# Patient Record
Sex: Female | Born: 1995 | Race: Black or African American | Hispanic: No | Marital: Single | Smoking: Current every day smoker
Health system: Southern US, Community
[De-identification: ages and names within clinical notes are randomized; demographics above are authoritative.]

## PROBLEM LIST (undated history)

## (undated) ENCOUNTER — Emergency Department: Admission: EM | Discharge status: Absent without leave | Payer: Medicaid Other

## (undated) ENCOUNTER — Inpatient Hospital Stay: Payer: Self-pay

## (undated) DIAGNOSIS — J45909 Unspecified asthma, uncomplicated: Secondary | ICD-10-CM

## (undated) DIAGNOSIS — R569 Unspecified convulsions: Secondary | ICD-10-CM

## (undated) DIAGNOSIS — F17200 Nicotine dependence, unspecified, uncomplicated: Secondary | ICD-10-CM

## (undated) HISTORY — DX: Unspecified convulsions: R56.9

## (undated) HISTORY — DX: Nicotine dependence, unspecified, uncomplicated: F17.200

## (undated) HISTORY — DX: Unspecified asthma, uncomplicated: J45.909

---
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
## 2013-04-21 DIAGNOSIS — R5383 Other fatigue: Secondary | ICD-10-CM | POA: Insufficient documentation

## 2013-04-21 DIAGNOSIS — M79672 Pain in left foot: Secondary | ICD-10-CM | POA: Insufficient documentation

## 2013-04-21 DIAGNOSIS — R42 Dizziness and giddiness: Secondary | ICD-10-CM | POA: Insufficient documentation

## 2013-06-25 ENCOUNTER — Emergency Department: Payer: Self-pay

## 2013-06-25 LAB — URINALYSIS, COMPLETE
BILIRUBIN, UR: NEGATIVE
Bacteria: NONE SEEN
Glucose,UR: NEGATIVE mg/dL (ref 0–75)
KETONE: NEGATIVE
Nitrite: NEGATIVE
PH: 6 (ref 4.5–8.0)
PROTEIN: NEGATIVE
Specific Gravity: 1.01 (ref 1.003–1.030)
Squamous Epithelial: 4
WBC UR: 19 /HPF (ref 0–5)

## 2013-06-25 LAB — GC/CHLAMYDIA PROBE AMP

## 2013-06-25 LAB — WET PREP, GENITAL

## 2013-07-19 ENCOUNTER — Emergency Department: Payer: Self-pay | Admitting: Emergency Medicine

## 2013-07-30 ENCOUNTER — Emergency Department: Payer: Self-pay | Admitting: Emergency Medicine

## 2014-04-03 ENCOUNTER — Emergency Department: Payer: Self-pay | Admitting: Emergency Medicine
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
## 2014-08-02 NOTE — Consult Note (Signed)
Referring Physician:  James Ivanoff, Roselie Awkward :   Primary Care Physician:  James Ivanoff, [facility] : [facility], [address] [place], [place] [address], [place] [phone]  Reason for Consult:  Admit Date: 28-Feb-2012   Chief Complaint: altered mental status   Reason for Consult: altered mental status   History of Present Illness:  History of Present Illness:   19 yo RHD F presents to [facility] via ambulance after an unwitnessed fall/seizure in which she barely injured herself.  She was confused for sometime after the episode and did not go to school like she was supposed to.   Pt has no recollection of this event and did not have any bowel/bladder incontinence or bite her tone.   Of note, she had a seizure 2 years ago but was not placed on medication because her EEG was negative.  However for the past 3 months, she has been having dizzy spells followed by fatigue that occur a few times a week.  Mom notes that now her personality is different.  ROS:   General denies complaints    HEENT no complaints    Lungs no complaints    Cardiac no complaints    GI no complaints    GU no complaints    Musculoskeletal no complaints    Extremities no complaints    Skin no complaints    Neuro seizure    Endocrine no complaints    Psych no complaints   Past Medical/Surgical Hx:  Asthma:   Past Medical/ Surgical Hx:   Past Medical History asthma    Past Surgical History none   Allergies:  No Known Allergies:   Allergies:   Allergies NKDA   Social/Family History:  Employment Status: unemployed   Lives With: parents   Living Arrangements: house   Social History: no tob, no EtOH, no illicits, 12 grade student   Family History: n/c   Vital Signs: **Vital Signs.:   15-Nov-13 14:23   Vital Signs Type Routine   Temperature Temperature (F) 98.6   Celsius 37   Temperature Source oral   Pulse Pulse 62   Respirations Respirations 18   Systolic BP Systolic  BP 93   Diastolic BP (mmHg) Diastolic BP (mmHg) 50   Mean BP 64   Pulse Ox % Pulse Ox % 98   Pulse Ox Activity Level  At rest   Oxygen Delivery Room Air/ 21 %   Fetal Heart Tones  136    12:45   Systolic BP Systolic BP 809   Diastolic BP (mmHg) Diastolic BP (mmHg) 73   Mean BP 88   BP Source  if not from Vital Sign Device standing   Physical Exam:  General: NAD resting comfortable in bed, normal weight   HEENT: normocephalic, sclera nonicteric, oropharynx clear   Neck: supple, no JVD, no bruits   Chest: CTA B, no wheezing, good movement   Cardiac: RRR, no murmurs, no edema, 2+ pulses   Extremities: no C/C/E, FROM   Neurologic Exam:  Mental Status: alert and oriented x 3, normal speech and language, follows complex commands;  inappropiate affect   Cranial Nerves: PERRLA, EOMI, nl VF, face symmetric, tongue midline, shoulder shrug equal   Motor Exam: 5/5 B normal, tone, no tremor   Deep Tendon Reflexes: 2+/4 B, plantars downgoing B, no Hoffman   Sensory Exam: decreased pinprick, temperature on R hemibody, nl vibration   Coordination: FTN and HTS WNL, nl RAM, nl gait   Lab Results: Thyroid:  14-Nov-13 13:49    Thyroid Stimulating Hormone 1.01 (0.45-4.50 (International Unit)  ----------------------- Pregnant patients have  different reference  ranges for TSH:  - - - - - - - - - -  Pregnant, first trimetser:  0.36 - 2.50 uIU/mL)  Hepatic:  15-Nov-13 05:05    Bilirubin, Total 0.3   Alkaline Phosphatase  51   SGPT (ALT) 21   SGOT (AST) 23   Total Protein, Serum  6.0   Albumin, Serum  2.7  Routine Micro:  14-Nov-13 17:10    Micro Text Report CSF CULTURE/AERO/GRAM ST   COMMENT                   NO GROWTH IN 8-12 HOURS   GRAM STAIN                RARE WHITE BLOOD CELLS   GRAM STAIN                NO ORGANISMS SEEN   ANTIBIOTIC                        Culture Comment NO GROWTH IN 8-12 HOURS   Gram Stain 1 RARE WHITE BLOOD CELLS   Gram Stain 2 NO ORGANISMS SEEN   Result(s) reported on 28 Feb 2012 at 01:09PM.  Routine Chem:  14-Nov-13 13:49    HCG Betasubunit Quant. Serum  9261 (1-3  (International Unit)  ----------------- Non-pregnant <5 Weeks Post LMP mIU/mL  3- 4 wk 9 - 130  4- 5 wk 75 - 2,600  5- 6 wk 850 - 20,800  6- 7 wk 4,000 - 100,000  7-12 wk 11,500 - 289,000 12-16 wk 18,000 - 137,000 16-29 wk 1,400 - 53,000 29-41 wk 940 - 60,000)  15-Nov-13 05:05    Glucose, Serum 91   BUN  4   Creatinine (comp)  0.55   Sodium, Serum 139   Potassium, Serum 4.2   Chloride, Serum  110   CO2, Serum 23   Calcium (Total), Serum  8.3   Osmolality (calc) 274   eGFR (African American) >60   eGFR (Non-African American) >60 (eGFR values <48m/min/1.73 m2 may be an indication of chronic kidney disease (CKD). Calculated eGFR is useful in patients with stable renal function. The eGFR calculation will not be reliable in acutely ill patients when serum creatinine is changing rapidly. It is not useful in  patients on dialysis. The eGFR calculation may not be applicable to patients at the low and high extremes of body sizes, pregnant women, and vegetarians.)   Anion Gap  6   Magnesium, Serum 1.9 (1.8-2.4 THERAPEUTIC RANGE: 4-7 mg/dL TOXIC: > 10 mg/dL  -----------------------)  Urine Drugs:  142-AST-41196:22   Tricyclic Antidepressant, Ur Qual (comp) NEGATIVE (Result(s) reported on 27 Feb 2012 at 04:23PM.)   Amphetamines, Urine Qual. NEGATIVE   MDMA, Urine Qual. NEGATIVE   Cocaine Metabolite, Urine Qual. NEGATIVE   Opiate, Urine qual NEGATIVE   Phencyclidine, Urine Qual. NEGATIVE   Cannabinoid, Urine Qual. NEGATIVE   Barbiturates, Urine Qual. NEGATIVE   Benzodiazepine, Urine Qual. NEGATIVE (----------------- The URINE DRUG SCREEN provides only a preliminary, unconfirmed analytical test result and should not be used for non-medical  purposes.  Clinical consideration and professional judgment should be  applied to any positive drug screen result  due to possible interfering substances.  A more specific alternate chemical method must be used in order to obtain a confirmed analytical result.  Gas  chromatography/mass spectrometry (GC/MS) is the preferred confirmatory method.)   Methadone, Urine Qual. NEGATIVE  Cardiac:  14-Nov-13 13:49    CK, Total 60   CPK-MB, Serum 0.5 (Result(s) reported on 27 Feb 2012 at 05:56PM.)   Troponin I < 0.02 (0.00-0.05 0.05 ng/mL or less: NEGATIVE  Repeat testing in 3-6 hrs  if clinically indicated. >0.05 ng/mL: POTENTIAL  MYOCARDIAL INJURY. Repeat  testing in 3-6 hrs if  clinically indicated. NOTE: An increase or decrease  of 30% or more on serial  testing suggests a  clinically important change)  Routine UA:  14-Nov-13 15:33    Color (UA) Yellow   Clarity (UA) Clear   Glucose (UA) Negative   Bilirubin (UA) Negative   Ketones (UA) Trace   Specific Gravity (UA) 1.011   Blood (UA) Negative   pH (UA) 8.0   Protein (UA) Negative   Nitrite (UA) Negative   Leukocyte Esterase (UA) Negative (Result(s) reported on 27 Feb 2012 at 03:55PM.)   RBC (UA) NONE SEEN   WBC (UA) <1 /HPF   Bacteria (UA) TRACE   Epithelial Cells (UA) 4 /HPF   Mucous (UA) PRESENT (Result(s) reported on 27 Feb 2012 at 03:55PM.)  CSF Analysis:  14-Nov-13 17:10    CSF Tube # 3   Color - Uncentrifuged (CSF) COLORLESS   Clarity (CSF) CLEAR   Color - Centrifuged (CSF) COLORLESS   RBC  (CSF) 0   WBC  (CSF) 24   Neutrophils  (CSF) 50   Lymphocytes  (CSF) 50   Monocytes/Macrophages  (CSF) 0   Eosinophil  (CSF) 0   Other Cells  (CSF) 0 (Result(s) reported on 27 Feb 2012 at 08:38PM.)   Glucose, CSF 50 (Result(s) reported on 27 Feb 2012 at 07:55PM.)   Protein, CSF 20 (Result(s) reported on 27 Feb 2012 at 07:55PM.)  Routine Coag:  14-Nov-13 13:49    Activated PTT (APTT) 34.3 (A HCT value >55% may artifactually increase the APTT. In one study, the increase was an average of 19%. Reference: "Effect on Routine and Special  Coagulation Testing Values of Citrate Anticoagulant Adjustment in Patients with High HCT Values." American Journal of Clinical Pathology 2006;126:400-405.)  15-Nov-13 05:05    Prothrombin 13.7   INR 1.0 (INR reference interval applies to patients on anticoagulant therapy. A single INR therapeutic range for coumarins is not optimal for all indications; however, the suggested range for most indications is 2.0 - 3.0. Exceptions to the INR Reference Range may include: Prosthetic heart valves, acute myocardial infarction, prevention of myocardial infarction, and combinations of aspirin and anticoagulant. The need for a higher or lower target INR must be assessed individually. Reference: The Pharmacology and Management of the Vitamin K  antagonists: the seventh ACCP Conference on Antithrombotic and Thrombolytic Therapy. XIPJA.2505 Sept:126 (3suppl): [id number]. A HCT value >55% may artifactually increase the PT.  In one study,  the increase was an average of 25%. Reference:  "Effect on Routine and Special Coagulation Testing Values of Citrate Anticoagulant Adjustment in Patients with High HCT Values." American Journal of Clinical Pathology 2006;126:400-405.)  Routine Hem:  15-Nov-13 05:05    WBC (CBC) 6.5   RBC (CBC)  3.64   Hemoglobin (CBC)  11.0   Hematocrit (CBC)  31.0   Platelet Count (CBC) 220   MCV 85   MCH 30.2   MCHC 35.5   RDW 13.2   Neutrophil % 55.6   Lymphocyte % 35.4   Monocyte % 6.9   Eosinophil % 1.8   Basophil %  0.3   Neutrophil # 3.6   Lymphocyte # 2.3   Monocyte # 0.5   Eosinophil # 0.1   Basophil # 0.0 (Result(s) reported on 28 Feb 2012 at [facility].)   Radiology Results: CT:    14-Nov-13 15:35, CT Head Without Contrast   CT Head Without Contrast    REASON FOR EXAM:    confusion alert x1  COMMENTS:       PROCEDURE: CT  - CT HEAD WITHOUT CONTRAST  - Feb 27 2012  3:35PM     RESULT: Comparison:  None    Technique: Multiple axial images from the foramen  magnum to the vertex   were obtained without IV contrast.    Findings:      There is no evidence of mass effect, midline shift, or extra-axial fluid   collections.  There is no evidence of a space-occupying lesion or   intracranial hemorrhage. There is no evidence of a cortical-based area of     acute infarction.    The ventricles and sulci are appropriate for the patient's age. The basal   cisterns are patent.    Visualized portions of the orbits are unremarkable. The visualized   portions of the paranasal sinuses and mastoid air cells are unremarkable.    The osseous structures are unremarkable.    IMPRESSION:      No acute intracranial process.      Dictation Site: 1          Verified By: Jennette Banker, M.D., MD   Radiology Impression:  Radiology Impression: CT of head personally reviewed by me and normal   Impression/Recommendations:  Recommendations:   labs reviewed and noted to have mild leukocytosis in CSF notes reviewed normal d/w referring physician   Probable seizure-  given the fact that risk of seizures outweights the risk of birth defects, we recommend treatment.  Pt also had increase in questionable seizure episodes as well.  There is also mild focality on examination. 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 PoMelrose Nakayaman 3-4 weeks  Electronic Signatures: SmJamison NeighborMD)  (Signed [id number])  Authored: REFERRING PHYSICIAN, Primary Care Physician, Consult, History of Present Illness, Review of Systems, PAST MEDICAL/SURGICAL HISTORY, ALLERGIES, Social/Family History, NURSING VITAL SIGNS, Physical Exam-, LAB RESULTS, RADIOLOGY RESULTS, Recommendations   Last Updated: 15-Nov-13 16:30 by SmJamison NeighborMD)
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
## 2014-09-30 DIAGNOSIS — F17200 Nicotine dependence, unspecified, uncomplicated: Secondary | ICD-10-CM | POA: Insufficient documentation

## 2014-09-30 DIAGNOSIS — R569 Unspecified convulsions: Secondary | ICD-10-CM | POA: Insufficient documentation

## 2014-09-30 DIAGNOSIS — J45909 Unspecified asthma, uncomplicated: Secondary | ICD-10-CM | POA: Insufficient documentation

## 2014-10-04 ENCOUNTER — Encounter: Payer: Self-pay | Admitting: Unknown Physician Specialty

## 2014-10-04 ENCOUNTER — Ambulatory Visit (INDEPENDENT_AMBULATORY_CARE_PROVIDER_SITE_OTHER): Payer: Medicaid Other | Admitting: Unknown Physician Specialty

## 2014-10-04 VITALS — BP 110/65 | HR 74 | Temp 97.9°F | Ht 65.7 in | Wt 134.0 lb

## 2014-10-04 DIAGNOSIS — G40909 Epilepsy, unspecified, not intractable, without status epilepticus: Secondary | ICD-10-CM | POA: Diagnosis not present

## 2014-10-04 DIAGNOSIS — J4520 Mild intermittent asthma, uncomplicated: Secondary | ICD-10-CM | POA: Diagnosis not present

## 2014-10-04 MED ORDER — ALBUTEROL SULFATE HFA 108 (90 BASE) MCG/ACT IN AERS: 2.0000 | INHALATION_SPRAY | Freq: Four times a day (QID) | RESPIRATORY_TRACT | Status: DC | PRN

## 2014-10-04 MED ORDER — NORETHIN ACE-ETH ESTRAD-FE 1-20 MG-MCG PO TABS: 1.0000 | ORAL_TABLET | Freq: Every day | ORAL | Status: DC

## 2014-10-04 NOTE — Progress Notes (Signed)
BP 110/65 mmHg  Pulse 74  Temp(Src) 97.9 F (36.6 C)  Ht 5' 5.7" (1.669 m)  Wt 134 lb (60.782 kg)  BMI 21.82 kg/m2  SpO2 99%  LMP 08/28/2014 (Exact Date)   Subjective:    Patient ID: Miranda King, female    DOB: [birth year], 19 y.o.   MRN: [id number]  HPI: Miranda King is a 19 y.o. female  Chief Complaint  Patient presents with  . Seizures    pt states she went to [facility] ER for having a seizure  . Medication Refill    pt states she needs her birth control and inhaler refilled   Seizures  Chronicity: this is the third one that mother knows about.  She does think she has periods of fainting spells and staring off in the distance. Episode onset: 9 days ago and went to the ER and recieved Keppra 500 mg BID. The problem has been resolved. There were 2 to 3 seizures. Associated symptoms include sleepiness, confusion, speech difficulty and neck stiffness. Pertinent negatives include no vomiting and no diarrhea. Characteristics include rhythmic jerking, loss of consciousness and bit tongue. Characteristics do not include bowel incontinence or bladder incontinence. The episode was witnessed. There was no sensation of an aura present. The seizures did not continue in the ED. Focality: grand mal. Possible causes include sleep deprivation and change in alcohol use. Possible causes do not include medication or dosage change or recent illness. Drinking a little bit 2 nights before There has been no fever.   She would like her BCPs and her inhaler refilled  Relevant past medical, surgical, family and social history reviewed and updated as indicated. Interim medical history since our last visit reviewed. Allergies and medications reviewed and updated.  Review of Systems  Gastrointestinal: Negative for vomiting, diarrhea and bowel incontinence.  Genitourinary: Negative for bladder incontinence.  Neurological: Positive for seizures, loss of consciousness and  speech difficulty.  Psychiatric/Behavioral: Positive for confusion.    Per HPI unless specifically indicated above     Objective:    BP 110/65 mmHg  Pulse 74  Temp(Src) 97.9 F (36.6 C)  Ht 5' 5.7" (1.669 m)  Wt 134 lb (60.782 kg)  BMI 21.82 kg/m2  SpO2 99%  LMP 08/28/2014 (Exact Date)  Wt Readings from Last 3 Encounters:  10/04/14 134 lb (60.782 kg)  04/22/14 135 lb (61.236 kg)    Physical Exam  Constitutional: She is oriented to person, place, and time. She appears well-developed and well-nourished. No distress.  HENT:  Head: Normocephalic and atraumatic.  Eyes: Conjunctivae and lids are normal. Right eye exhibits no discharge. Left eye exhibits no discharge. No scleral icterus.  Cardiovascular: Normal rate, regular rhythm and normal heart sounds.   Pulmonary/Chest: Effort normal and breath sounds normal. No respiratory distress.  Abdominal: Normal appearance. There is no splenomegaly or hepatomegaly.  Musculoskeletal: Normal range of motion.  Neurological: She is alert and oriented to person, place, and time.  Skin: Skin is intact. No rash noted. No pallor.  Psychiatric: She has a normal mood and affect. Her behavior is normal. Judgment and thought content normal.       Assessment & Plan:   Problem List Items Addressed This Visit      Respiratory   Asthma   Relevant Medications   albuterol (PROVENTIL HFA;VENTOLIN HFA) 108 (90 BASE) MCG/ACT inhaler     Nervous and Auditory   Seizure disorder - Primary    Pt is here for a  seizure disorder and is taking Keppra.  We will refer her to neurology as finding the side effects of Keppra of fatigue and sleepiness.        Relevant Orders   Ambulatory referral to Neurology       Follow up plan: Return in about 3 months (around 01/04/2015) for physical.

## 2014-10-04 NOTE — Patient Instructions (Signed)

## 2014-10-04 NOTE — Assessment & Plan Note (Signed)
Pt is here for a seizure disorder and is taking Keppra.  We will refer her to neurology as finding the side effects of Keppra of fatigue and sleepiness.

## 2014-10-05 ENCOUNTER — Emergency Department
Admission: EM | Admit: 2014-10-05 | Discharge: 2014-10-05 | Discharge status: Against Medical Advice | Payer: Medicaid Other | Attending: Emergency Medicine | Admitting: Emergency Medicine

## 2014-10-05 DIAGNOSIS — R569 Unspecified convulsions: Secondary | ICD-10-CM | POA: Diagnosis present

## 2014-10-05 LAB — BASIC METABOLIC PANEL
Anion gap: 9 (ref 5–15)
BUN: 12 mg/dL (ref 6–20)
CALCIUM: 9.2 mg/dL (ref 8.9–10.3)
CHLORIDE: 102 mmol/L (ref 101–111)
CO2: 26 mmol/L (ref 22–32)
Creatinine, Ser: 0.75 mg/dL (ref 0.44–1.00)
GFR calc Af Amer: >60 mL/min (ref >60)
GFR calc non Af Amer: >60 mL/min (ref >60)
GLUCOSE: 89 mg/dL (ref 65–99)
Potassium: 3.9 mmol/L (ref 3.5–5.1)
Sodium: 137 mmol/L (ref 135–145)

## 2014-10-05 LAB — CBC
HEMATOCRIT: 39.9 % (ref 35.0–47.0)
Hemoglobin: 13.7 g/dL (ref 12.0–16.0)
MCH: 28.9 pg (ref 26.0–34.0)
MCHC: 34.2 g/dL (ref 32.0–36.0)
MCV: 84.3 fL (ref 80.0–100.0)
Platelets: 310 10*3/uL (ref 150–440)
RBC: 4.73 MIL/uL (ref 3.80–5.20)
RDW: 13.8 % (ref 11.5–14.5)
WBC: 6 10*3/uL (ref 3.6–11.0)

## 2014-10-05 NOTE — ED Notes (Signed)
Pt was started on keppra last Sunday for seizures, awaiting to see neurologist for testing.  Today she co dizziness, headache and nausea pmd told her to come to ED.

## 2014-11-23 ENCOUNTER — Ambulatory Visit: Payer: Medicaid Other | Admitting: Unknown Physician Specialty

## 2015-03-03 IMAGING — CR DG FOOT COMPLETE 3+V*L*
1 series · 3 of 3 positions shown · non-contrast
Comparison: None.

CLINICAL DATA: Fall down steps, left foot pain

EXAM:
LEFT FOOT - COMPLETE 3+ VIEW

[Series 1: ap · 0.17mm/px · 3 of 3 slices shown]
[im 1/3]
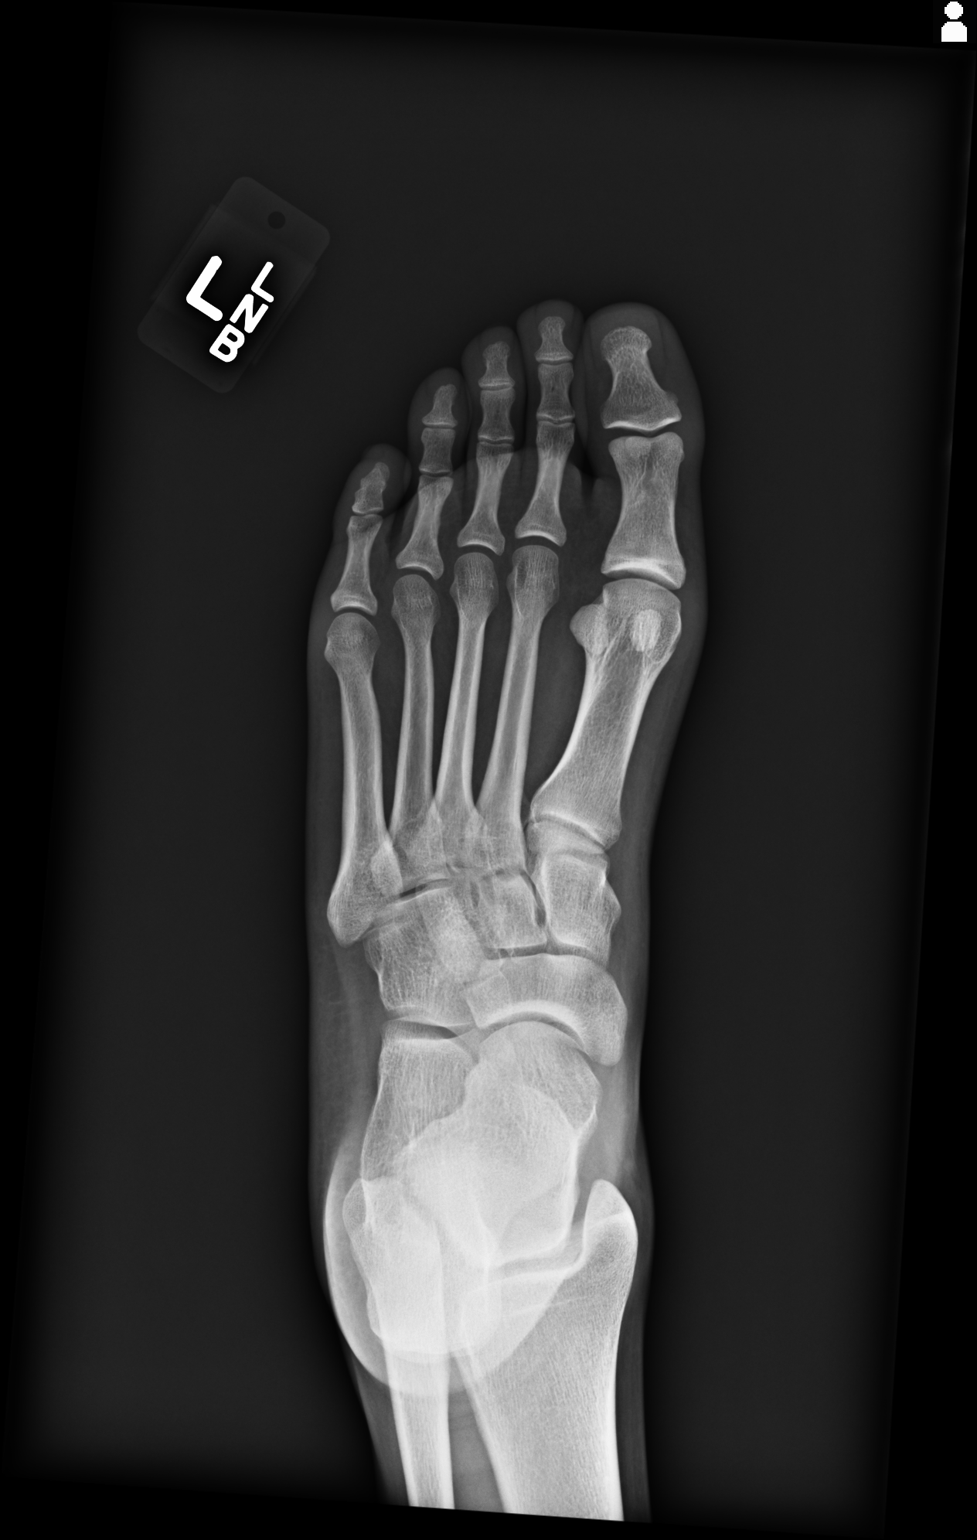
[im 2/3]
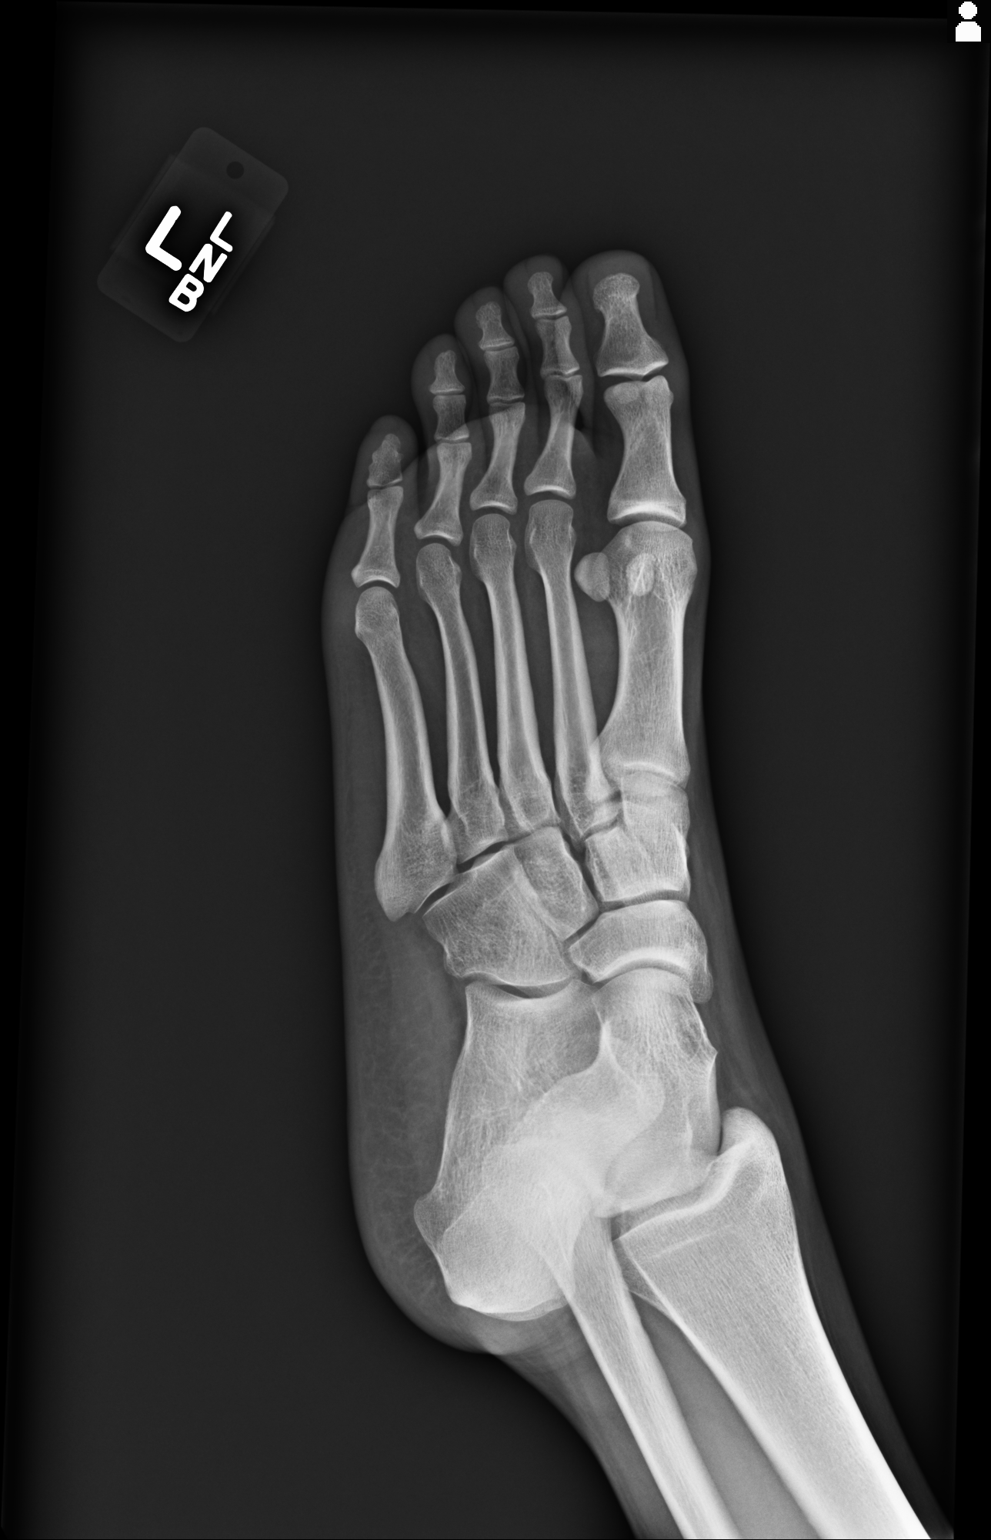
[im 3/3]
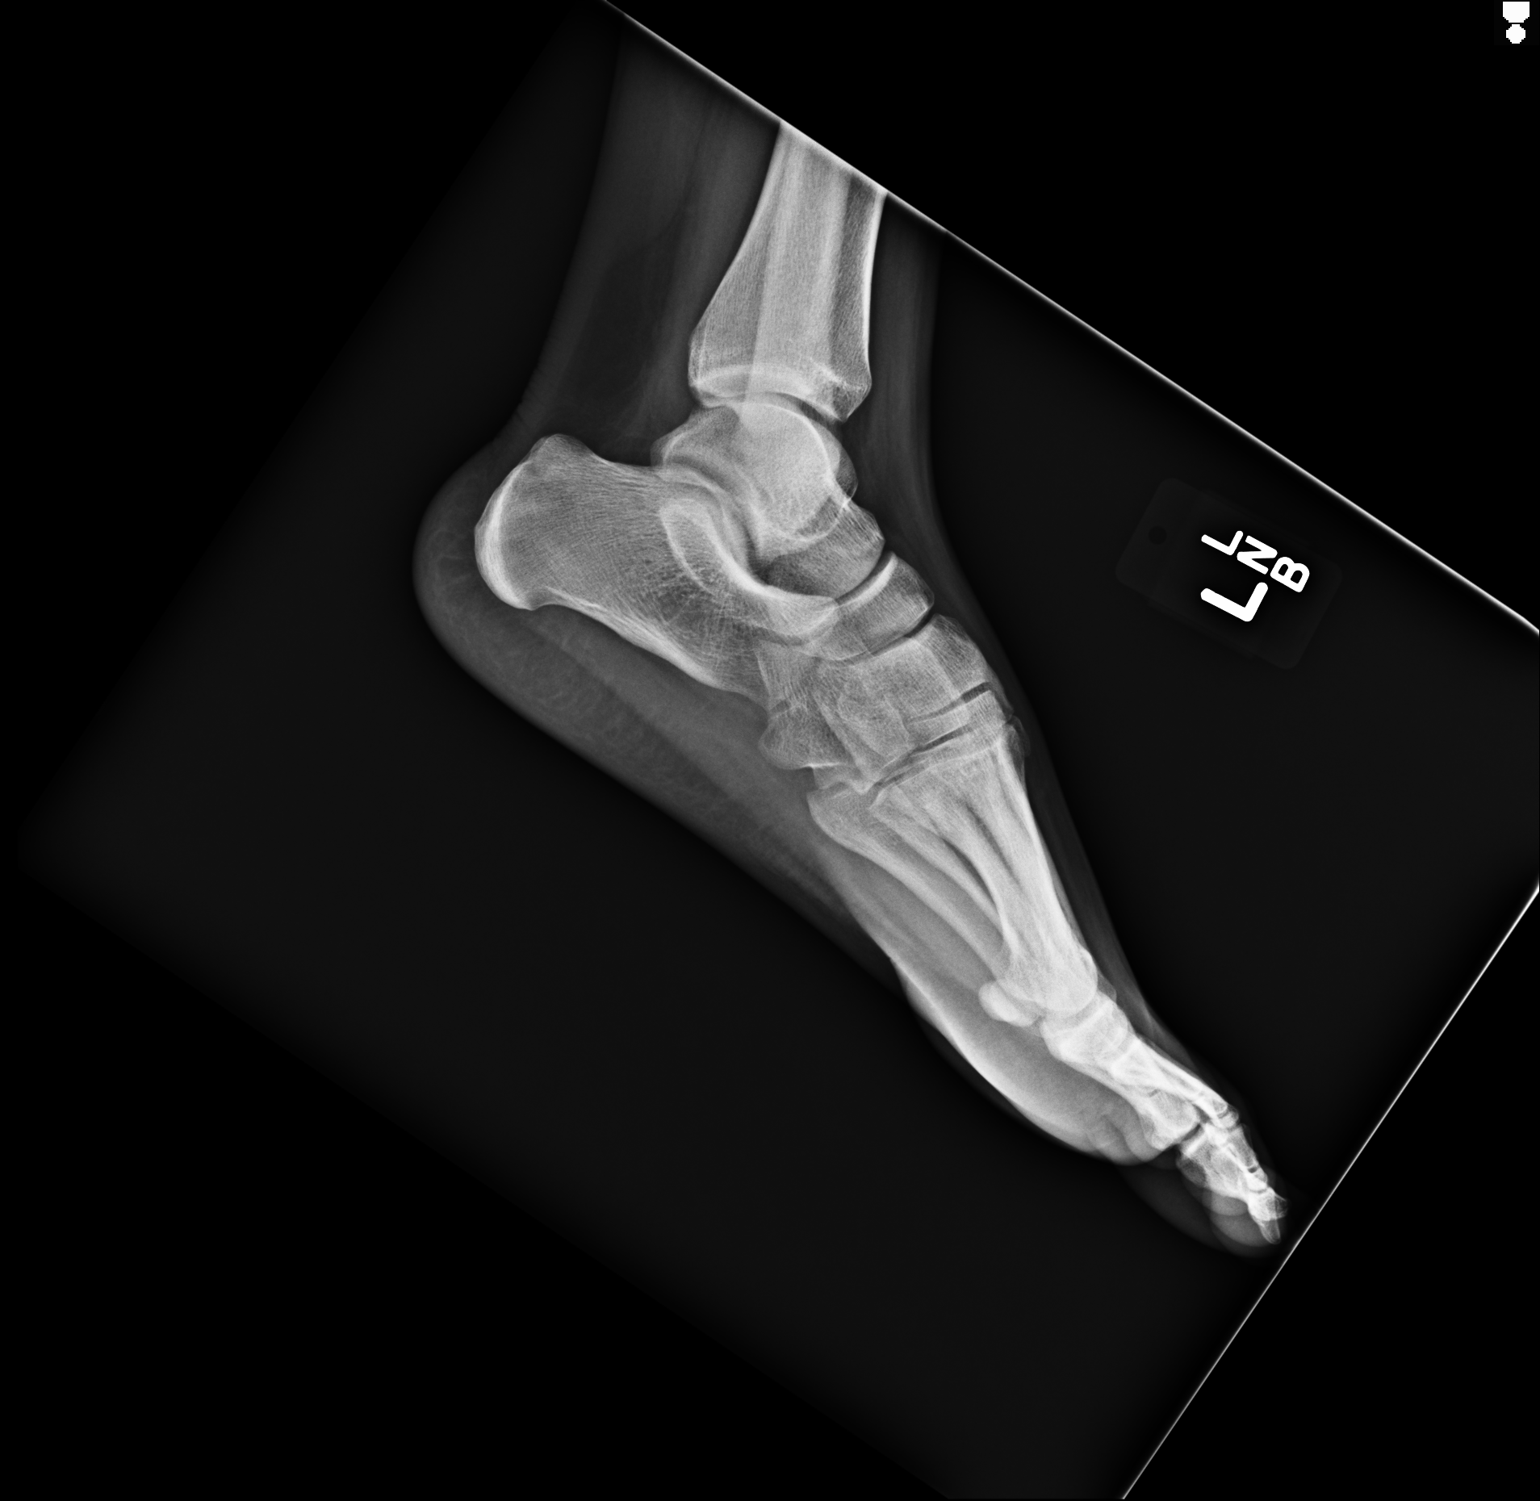

[3 of 3 positions shown; findings below may reference images not displayed]

FINDINGS: No fracture or dislocation is seen.

The joint spaces are preserved.

The visualized soft tissues are unremarkable.
IMPRESSION: No fracture or dislocation is seen.

## 2015-05-23 DIAGNOSIS — L818 Other specified disorders of pigmentation: Secondary | ICD-10-CM | POA: Insufficient documentation

## 2015-05-23 DIAGNOSIS — Z0000 Encounter for general adult medical examination without abnormal findings: Secondary | ICD-10-CM | POA: Insufficient documentation

## 2015-09-01 ENCOUNTER — Ambulatory Visit (INDEPENDENT_AMBULATORY_CARE_PROVIDER_SITE_OTHER): Payer: Medicaid Other | Admitting: Unknown Physician Specialty

## 2015-09-01 ENCOUNTER — Encounter: Payer: Self-pay | Admitting: Unknown Physician Specialty

## 2015-09-01 VITALS — BP 111/74 | HR 67 | Temp 98.1°F | Ht 66.8 in | Wt 131.6 lb

## 2015-09-01 DIAGNOSIS — R112 Nausea with vomiting, unspecified: Secondary | ICD-10-CM

## 2015-09-01 DIAGNOSIS — O210 Mild hyperemesis gravidarum: Secondary | ICD-10-CM

## 2015-09-01 LAB — UA/M W/RFLX CULTURE, ROUTINE
BILIRUBIN UA: NEGATIVE
Glucose, UA: NEGATIVE
Ketones, UA: NEGATIVE
Leukocytes, UA: NEGATIVE
Nitrite, UA: NEGATIVE
PH UA: 7 (ref 5.0–7.5)
PROTEIN UA: NEGATIVE
RBC UA: NEGATIVE
SPEC GRAV UA: 1.02 (ref 1.005–1.030)
UUROB: 1 mg/dL (ref 0.2–1.0)

## 2015-09-01 MED ORDER — ONDANSETRON HCL 4 MG PO TABS: 4.0000 mg | ORAL_TABLET | Freq: Three times a day (TID) | ORAL | Status: DC | PRN

## 2015-09-01 NOTE — Progress Notes (Signed)
BP 111/74 mmHg  Pulse 67  Temp(Src) 98.1 F (36.7 C)  Ht 5' 6.8" (1.697 m)  Wt 131 lb 9.6 oz (59.693 kg)  BMI 20.73 kg/m2  SpO2 100%  LMP 07/26/2015 (Exact Date)   Subjective:    Patient ID: Miranda King, female    DOB: 15-Jan-1996, 20 y.o.   MRN: [id number]  HPI: Miranda King is a 20 y.o. female  Chief Complaint  Patient presents with  . Abdominal Pain    pt states she has just recently found out she was pregnant and has not been able to keep anything down except for orange juice and water since finding out   Pt states she went to social services for diagnosis.  She is about [redacted] weeks pregnant.   Pt states she is vomiting for 5 days.    Relevant past medical, surgical, family and social history reviewed and updated as indicated. Interim medical history since our last visit reviewed. Allergies and medications reviewed and updated.  Review of Systems  Per HPI unless specifically indicated above     Objective:    BP 111/74 mmHg  Pulse 67  Temp(Src) 98.1 F (36.7 C)  Ht 5' 6.8" (1.697 m)  Wt 131 lb 9.6 oz (59.693 kg)  BMI 20.73 kg/m2  SpO2 100%  LMP 07/26/2015 (Exact Date)  Wt Readings from Last 3 Encounters:  09/01/15 131 lb 9.6 oz (59.693 kg)  10/05/14 134 lb (60.782 kg)  10/04/14 134 lb (60.782 kg)    Physical Exam  Constitutional: She is oriented to person, place, and time. She appears well-developed and well-nourished. No distress.  HENT:  Head: Normocephalic and atraumatic.  Eyes: Conjunctivae and lids are normal. Right eye exhibits no discharge. Left eye exhibits no discharge. No scleral icterus.  Cardiovascular: Normal rate, regular rhythm and normal heart sounds.   Pulmonary/Chest: Effort normal and breath sounds normal.  Abdominal: Normal appearance. There is no splenomegaly or hepatomegaly.  Musculoskeletal: Normal range of motion.  Neurological: She is alert and oriented to person, place, and time.  Skin: Skin is intact. No rash noted.  No pallor.  Psychiatric: She has a normal mood and affect. Her behavior is normal. Judgment and thought content normal.   Urine is negative.  No Ketones  Results for orders placed or performed during the hospital encounter of 10/05/14  CBC  Result Value Ref Range   WBC 6.0 3.6 - 11.0 K/uL   RBC 4.73 3.80 - 5.20 MIL/uL   Hemoglobin 13.7 12.0 - 16.0 g/dL   HCT 11.9 14.7 - 82.9 %   MCV 84.3 80.0 - 100.0 fL   MCH 28.9 26.0 - 34.0 pg   MCHC 34.2 32.0 - 36.0 g/dL   RDW 56.2 13.0 - 86.5 %   Platelets 310 150 - 440 K/uL  Basic metabolic panel  Result Value Ref Range   Sodium 137 135 - 145 mmol/L   Potassium 3.9 3.5 - 5.1 mmol/L   Chloride 102 101 - 111 mmol/L   CO2 26 22 - 32 mmol/L   Glucose, Bld 89 65 - 99 mg/dL   BUN 12 6 - 20 mg/dL   Creatinine, Ser 7.84 0.44 - 1.00 mg/dL   Calcium 9.2 8.9 - 69.6 mg/dL   GFR calc non Af Amer >60 >60 mL/min   GFR calc Af Amer >60 >60 mL/min   Anion gap 9 5 - 15      Assessment & Plan:   Problem List Items Addressed This Visit  None    Visit Diagnoses    Non-intractable vomiting with nausea, vomiting of unspecified type    -  Primary    Relevant Orders    UA/M w/rflx Culture, Routine    Hyperemesis gravidarum        Rx for Zofran.  Diet suggestions given       She will make own appt with [facility].    Follow up plan: Return if symptoms worsen or fail to improve.

## 2015-09-30 ENCOUNTER — Emergency Department
Admission: EM | Admit: 2015-09-30 | Discharge: 2015-09-30 | Disposition: A | Discharge status: Home / Self Care | Payer: Medicaid Other | Attending: Emergency Medicine | Admitting: Emergency Medicine

## 2015-09-30 ENCOUNTER — Encounter: Payer: Self-pay | Admitting: *Deleted

## 2015-09-30 DIAGNOSIS — Z87891 Personal history of nicotine dependence: Secondary | ICD-10-CM | POA: Insufficient documentation

## 2015-09-30 DIAGNOSIS — J45909 Unspecified asthma, uncomplicated: Secondary | ICD-10-CM | POA: Insufficient documentation

## 2015-09-30 DIAGNOSIS — Z8669 Personal history of other diseases of the nervous system and sense organs: Secondary | ICD-10-CM | POA: Diagnosis not present

## 2015-09-30 DIAGNOSIS — Z3A00 Weeks of gestation of pregnancy not specified: Secondary | ICD-10-CM | POA: Diagnosis not present

## 2015-09-30 DIAGNOSIS — R109 Unspecified abdominal pain: Secondary | ICD-10-CM | POA: Diagnosis present

## 2015-09-30 DIAGNOSIS — O021 Missed abortion: Secondary | ICD-10-CM

## 2015-09-30 DIAGNOSIS — Z79899 Other long term (current) drug therapy: Secondary | ICD-10-CM | POA: Insufficient documentation

## 2015-09-30 LAB — HCG, QUANTITATIVE, PREGNANCY: hCG, Beta Chain, Quant, S: 104678 m[IU]/mL — ABNORMAL HIGH (ref <5)

## 2015-09-30 LAB — ABO/RH: ABO/RH(D): O POS

## 2015-09-30 NOTE — Discharge Instructions (Signed)
Please take over-the-counter Tylenol as needed (up to 1000 mg every 6 hours, but no more than this).  Dr. Tiburcio PeaHarris' office will contact you on Monday, [facility]

## 2015-09-30 NOTE — ED Notes (Signed)
Pt saw Dr Tiburcio Peaharris yesterday, pt is 2 months pregnant, pt was told by MD she was having a miscarriage, MD gave pt the option to have surgery or pass the fetus on her own, pt denied surgery, pt complains of abdominal cramping with nausea

## 2015-09-30 NOTE — ED Provider Notes (Signed)
[facility] Emergency Department Provider Note  ____________________________________________  Time seen: Approximately 1:00 PM  I have reviewed the triage vital signs and the nursing notes.   HISTORY  Chief Complaint Abdominal Cramping    HPI Miranda King is a 20 y.o. female G2 P1 at approximately 10 weeks' gestation who comes in for abdominal cramping.  She saw Dr. Tiburcio Pea at [facility] yesterday and was told she had a missed abortion.  She reportedly was given the option between a scheduled D&C next week or "waiting to let it happen naturally". She chose to go home, but states that she has "freaked herself out" in the meantime.  She does not like the idea of having a "dead thing" inside of her and when she awoke this morning she had acute onset of severe lower abdominal cramps.  She has also had some nausea.  She denies vomiting, fever/chills, chest pain, shortness of breath.  She also denies vaginal bleeding.  She states that she feels cramps that feel like when she gave birth to her 16-year-old daughter.  Nothing makes it better and nothing makes it worse.   Past Medical History  Diagnosis Date  . Asthma   . Tobacco use disorder   . Seizures [facility])     Patient Active Problem List   Diagnosis Date Noted  . Seizure disorder (HCC) 10/04/2014  . Asthma 09/30/2014  . Tobacco use disorder 09/30/2014  . Contraception management 09/30/2014    Past Surgical History  Procedure Laterality Date  . Vaginal delivery      Current Outpatient Rx  Name  Route  Sig  Dispense  Refill  . albuterol (PROVENTIL HFA;VENTOLIN HFA) 108 (90 BASE) MCG/ACT inhaler   Inhalation   Inhale 2 puffs into the lungs every 6 (six) hours as needed for wheezing or shortness of breath.   1 Inhaler   2   . norethindrone-ethinyl estradiol (JUNEL FE,GILDESS FE,LOESTRIN FE) 1-20 MG-MCG tablet   Oral   Take 1 tablet by mouth daily. Patient not taking: Reported on 09/01/2015  3 Package   12   . ondansetron (ZOFRAN) 4 MG tablet   Oral   Take 1 tablet (4 mg total) by mouth every 8 (eight) hours as needed for nausea or vomiting.   20 tablet   0     Allergies Coconut oil and Tramadol hcl  Family History  Problem Relation Age of Onset  . Migraines Mother   . Asthma Sister   . Hypertension Maternal Grandmother   . Diabetes Maternal Grandmother     Social History Social History  Substance Use Topics  . Smoking status: Former Games developer  . Smokeless tobacco: Never Used  . Alcohol Use: No    Review of Systems Constitutional: No fever/chills Eyes: No visual changes. ENT: No sore throat. Cardiovascular: Denies chest pain. Respiratory: Denies shortness of breath. Gastrointestinal: +abd cramping.  nausea, no vomiting.  No diarrhea.  No constipation. Genitourinary: Negative for dysuria. Musculoskeletal: Negative for back pain. Skin: Negative for rash. Neurological: Negative for headaches, focal weakness or numbness.  10-point ROS otherwise negative.  ____________________________________________   PHYSICAL EXAM:  VITAL SIGNS: ED Triage Vitals  Enc Vitals Group     BP 09/30/15 1110 119/65 mmHg     Pulse Rate 09/30/15 1110 93     Resp 09/30/15 1110 20     Temp 09/30/15 1110 98.5 F (36.9 C)     Temp Source 09/30/15 1110 Oral     SpO2 09/30/15  1110 99 %     Weight 09/30/15 1110 131 lb (59.421 kg)     Height 09/30/15 1110  (1.676 m)     Head Cir --      Peak Flow --      Pain Score 09/30/15 1110 7     Pain Loc --      Pain Edu? --      Excl. in GC? --     Constitutional: Alert and oriented. Well appearing and in no acute distress. Eyes: Conjunctivae are normal. PERRL. EOMI. Head: Atraumatic. Nose: No congestion/rhinnorhea. Mouth/Throat: Mucous membranes are moist.  Oropharynx non-erythematous. Neck: No stridor.  No meningeal signs.   Cardiovascular: Normal rate, regular rhythm. Good peripheral circulation. Grossly normal heart  sounds.   Respiratory: Normal respiratory effort.  No retractions. Lungs CTAB. Gastrointestinal: Soft and nontender. No distention.  GU:  Deferred - just examined by [facility] doctor yesterday Musculoskeletal: No lower extremity tenderness nor edema. No gross deformities of extremities. Neurologic:  Normal speech and language. No gross focal neurologic deficits are appreciated.  Skin:  Skin is warm, dry and intact. No rash noted.  ____________________________________________   LABS (all labs ordered are listed, but only abnormal results are displayed)  Labs Reviewed  HCG, QUANTITATIVE, PREGNANCY - Abnormal; Notable for the following:    hCG, Beta Francene Finders [id number] (*)    All other components within normal limits  ABO/RH   ____________________________________________  EKG  None ____________________________________________  RADIOLOGY   No results found.  ____________________________________________   PROCEDURES  Procedure(s) performed:   Procedures   ____________________________________________   INITIAL IMPRESSION / ASSESSMENT AND PLAN / ED COURSE  Pertinent labs & imaging results that were available during my care of the patient were reviewed by me and considered in my medical decision making (see chart for details).  The patient is O+ and will not need RhoGAM.  I am paging Dr. Tiburcio Pea to speak with him because I do not have access to any of the records from her visit yesterday.  I will discuss with him expectant management versus Cytotec versus scheduled D&C.  The patient is in no acute distress in spite of her history of present illness she has normal vital signs and is afebrile at this time.   (Note that documentation was delayed due to multiple ED patients requiring immediate care.)   I spoke with phone with Dr. Tiburcio Pea.  He confirmed the history as she told me and indicated that there was no indication from his perspective that I needed to pursue additional  exam or workup.  He agreed there is no indication for admission or emergent D&C since she is not having any vaginal bleeding and is hemodynamically stable.  He said that he would contact her on Monday with plans for a D&C on Tuesday.  He said he would not recommend Cytotec for this patient.  I updated her with this information and she understands and agrees with the plan.  She denied any prescriptions for stronger pain medicine.  I gave her my usual customary return precautions. ____________________________________________  FINAL CLINICAL IMPRESSION(S) / ED DIAGNOSES  Final diagnoses:  Missed abortion     MEDICATIONS GIVEN DURING THIS VISIT:  Medications - No data to display   NEW OUTPATIENT MEDICATIONS STARTED DURING THIS VISIT:  Discharge Medication List as of 09/30/2015  1:31 PM        Note:  This document was prepared using Dragon voice recognition software and may include unintentional dictation  errors.   Loleta Roseory Bodee Lafoe, MD 09/30/15 2100

## 2015-09-30 NOTE — ED Notes (Signed)
Discussed discharge instructions and follow-up care with patient. No questions or concerns at this time. Pt stable at discharge.  

## 2015-10-03 ENCOUNTER — Encounter: Payer: Self-pay | Admitting: *Deleted

## 2015-10-03 ENCOUNTER — Ambulatory Visit: Payer: Medicaid Other | Admitting: Anesthesiology

## 2015-10-03 ENCOUNTER — Ambulatory Visit
Admission: RE | Admit: 2015-10-03 | Discharge: 2015-10-03 | Disposition: A | Discharge status: Home / Self Care | Payer: Medicaid Other | Source: Ambulatory Visit | Attending: Obstetrics & Gynecology | Admitting: Obstetrics & Gynecology

## 2015-10-03 ENCOUNTER — Encounter
Admission: RE | Disposition: A | Discharge status: Home / Self Care | Payer: Self-pay | Source: Ambulatory Visit | Attending: Obstetrics & Gynecology

## 2015-10-03 DIAGNOSIS — Z87891 Personal history of nicotine dependence: Secondary | ICD-10-CM | POA: Diagnosis not present

## 2015-10-03 DIAGNOSIS — O021 Missed abortion: Secondary | ICD-10-CM | POA: Insufficient documentation

## 2015-10-03 DIAGNOSIS — J45909 Unspecified asthma, uncomplicated: Secondary | ICD-10-CM | POA: Diagnosis not present

## 2015-10-03 HISTORY — PX: DILATION AND EVACUATION: SHX1459

## 2015-10-03 LAB — CBC
HEMATOCRIT: 35.8 % (ref 35.0–47.0)
HEMOGLOBIN: 12.2 g/dL (ref 12.0–16.0)
MCH: 29 pg (ref 26.0–34.0)
MCHC: 34 g/dL (ref 32.0–36.0)
MCV: 85.3 fL (ref 80.0–100.0)
Platelets: 238 10*3/uL (ref 150–440)
RBC: 4.2 MIL/uL (ref 3.80–5.20)
RDW: 14.5 % (ref 11.5–14.5)
WBC: 4.7 10*3/uL (ref 3.6–11.0)

## 2015-10-03 LAB — TYPE AND SCREEN
ABO/RH(D): O POS
Antibody Screen: NEGATIVE

## 2015-10-03 SURGERY — DILATION AND EVACUATION, UTERUS
Anesthesia: General

## 2015-10-03 MED ORDER — OXYTOCIN 10 UNIT/ML IJ SOLN
INTRAMUSCULAR | Status: DC | PRN
  Administered 2015-10-03: 20 [IU]

## 2015-10-03 MED ORDER — LACTATED RINGERS IV SOLN
INTRAVENOUS | Status: DC
  Administered 2015-10-03: 12:00:00 via INTRAVENOUS

## 2015-10-03 MED ORDER — FENTANYL CITRATE (PF) 100 MCG/2ML IJ SOLN: 25.0000 ug | INTRAMUSCULAR | Status: DC | PRN

## 2015-10-03 MED ORDER — OXYTOCIN 10 UNIT/ML IJ SOLN
INTRAMUSCULAR | Status: AC
  Filled 2015-10-03: qty 2

## 2015-10-03 MED ORDER — LIDOCAINE HCL (CARDIAC) 20 MG/ML IV SOLN
INTRAVENOUS | Status: DC | PRN
  Administered 2015-10-03: 80 mg via INTRAVENOUS

## 2015-10-03 MED ORDER — KETOROLAC TROMETHAMINE 30 MG/ML IJ SOLN
INTRAMUSCULAR | Status: DC | PRN
  Administered 2015-10-03: 30 mg via INTRAVENOUS

## 2015-10-03 MED ORDER — IBUPROFEN 400 MG PO TABS: 400.0000 mg | ORAL_TABLET | Freq: Four times a day (QID) | ORAL | Status: DC | PRN

## 2015-10-03 MED ORDER — GLYCOPYRROLATE 0.2 MG/ML IJ SOLN
INTRAMUSCULAR | Status: DC | PRN
  Administered 2015-10-03: 0.2 mg via INTRAVENOUS

## 2015-10-03 MED ORDER — DOXYCYCLINE HYCLATE 100 MG PO CAPS: 100.0000 mg | ORAL_CAPSULE | Freq: Two times a day (BID) | ORAL | Status: DC

## 2015-10-03 MED ORDER — DEXAMETHASONE SODIUM PHOSPHATE 10 MG/ML IJ SOLN
INTRAMUSCULAR | Status: DC | PRN
  Administered 2015-10-03: 10 mg via INTRAVENOUS

## 2015-10-03 MED ORDER — KETAMINE HCL 50 MG/ML IJ SOLN
INTRAMUSCULAR | Status: DC | PRN
  Administered 2015-10-03: 20 mg via INTRAMUSCULAR

## 2015-10-03 MED ORDER — METHYLERGONOVINE MALEATE 0.2 MG PO TABS: 0.2000 mg | ORAL_TABLET | Freq: Four times a day (QID) | ORAL | Status: DC

## 2015-10-03 MED ORDER — ONDANSETRON HCL 4 MG/2ML IJ SOLN
4.0000 mg | Freq: Once | INTRAMUSCULAR | Status: AC | PRN
  Administered 2015-10-03: 4 mg via INTRAVENOUS

## 2015-10-03 MED ORDER — FENTANYL CITRATE (PF) 100 MCG/2ML IJ SOLN
INTRAMUSCULAR | Status: DC | PRN
  Administered 2015-10-03 (×2): 25 ug via INTRAVENOUS

## 2015-10-03 MED ORDER — PROPOFOL 10 MG/ML IV BOLUS
INTRAVENOUS | Status: DC | PRN
  Administered 2015-10-03: 150 mg via INTRAVENOUS

## 2015-10-03 SURGICAL SUPPLY — 22 items
BAG COUNTER SPONGE EZ (MISCELLANEOUS) ×2 IMPLANT
CANISTER SUC SOCK COL 7IN (MISCELLANEOUS) ×3 IMPLANT
CATH ROBINSON RED A/P 16FR (CATHETERS) ×3 IMPLANT
COUNTER SPONGE BAG EZ (MISCELLANEOUS) ×1
FILTER UTR ASPR SPEC (MISCELLANEOUS) ×1 IMPLANT
FLTR UTR ASPR SPEC (MISCELLANEOUS) ×3
GLOVE BIO SURGEON STRL SZ8 (GLOVE) ×3 IMPLANT
GOWN STRL REUS W/ TWL LRG LVL3 (GOWN DISPOSABLE) ×1 IMPLANT
GOWN STRL REUS W/ TWL XL LVL3 (GOWN DISPOSABLE) ×1 IMPLANT
GOWN STRL REUS W/TWL LRG LVL3 (GOWN DISPOSABLE) ×2
GOWN STRL REUS W/TWL XL LVL3 (GOWN DISPOSABLE) ×2
KIT BERKELEY 1ST TRIMESTER 3/8 (MISCELLANEOUS) ×3 IMPLANT
KIT RM TURNOVER CYSTO AR (KITS) ×3 IMPLANT
NS IRRIG 500ML POUR BTL (IV SOLUTION) ×3 IMPLANT
PACK DNC HYST (MISCELLANEOUS) ×3 IMPLANT
PAD OB MATERNITY 4.3X12.25 (PERSONAL CARE ITEMS) ×3 IMPLANT
PAD PREP 24X41 OB/GYN DISP (PERSONAL CARE ITEMS) ×3 IMPLANT
SET BERKELEY SUCTION TUBING (SUCTIONS) ×3 IMPLANT
TOWEL OR 17X26 4PK STRL BLUE (TOWEL DISPOSABLE) ×3 IMPLANT
VACURETTE 10 RIGID CVD (CANNULA) IMPLANT
VACURETTE 12 RIGID CVD (CANNULA) IMPLANT
VACURETTE 8 RIGID CVD (CANNULA) ×3 IMPLANT

## 2015-10-03 NOTE — Anesthesia Procedure Notes (Signed)
Procedure Name: LMA Insertion Date/Time: 10/03/2015 2:18 PM Performed by: Peyton NajjarSIMMONS, Vannesa Abair Pre-anesthesia Checklist: Patient identified, Patient being monitored, Timeout performed, Emergency Drugs available and Suction available Patient Re-evaluated:Patient Re-evaluated prior to inductionOxygen Delivery Method: Circle system utilized Preoxygenation: Pre-oxygenation with 100% oxygen Intubation Type: IV induction Ventilation: Mask ventilation without difficulty LMA: LMA inserted LMA Size: 4.0 Tube type: Oral Number of attempts: 1 Placement Confirmation: positive ETCO2 and breath sounds checked- equal and bilateral Tube secured with: Tape Dental Injury: Teeth and Oropharynx as per pre-operative assessment

## 2015-10-03 NOTE — Discharge Instructions (Addendum)
General Gynecological Post-Operative Instructions You may expect to feel dizzy, weak, and drowsy for as long as 24 hours after receiving the medicine that made you sleep (anesthetic).  Do not drive a car, ride a bicycle, participate in physical activities, or take public transportation until you are done taking narcotic pain medicines or as directed by your doctor.  Do not drink alcohol or take tranquilizers.  Do not take medicine that has not been prescribed by your doctor.  Do not sign important papers or make important decisions while on narcotic pain medicines.  Have a responsible person with you.  CARE OF INCISION  Keep incision clean and dry. Take showers instead of baths until your doctor gives you permission to take baths.  Avoid heavy lifting (more than 10 pounds/4.5 kilograms), pushing, or pulling.  Avoid activities that may risk injury to your surgical site.  No sexual intercourse or placement of anything in the vagina for 2 weeks or as instructed by your doctor. If you have tubes coming from the wound site, check with your doctor regarding appropriate care of the tubes. Only take prescription or over-the-counter medicines  for pain, discomfort, or fever as directed by your doctor. Do not take aspirin. It can make you bleed. Take medicines (antibiotics) that kill germs if they are prescribed for you.  Call the office or go to the ER if:  You feel sick to your stomach (nauseous) and you start to throw up (vomit).  You have trouble eating or drinking.  You have an oral temperature above 101.  You have constipation that is not helped by adjusting diet or increasing fluid intake. Pain medicines are a common cause of constipation.  You have any other concerns. SEEK IMMEDIATE MEDICAL CARE IF:  You have persistent dizziness.  You have difficulty breathing or a congested sounding (croupy) cough.  You have an oral temperature above 102.5, not controlled by medicine.  There is increasing  pain or tenderness near or in the surgical site.    \AMBULATORY SURGERY  DISCHARGE INSTRUCTIONS   1) The drugs that you were given will stay in your system until tomorrow so for the next 24 hours you should not:  A) Drive an automobile B) Make any legal decisions C) Drink any alcoholic beverage   2) You may resume regular meals tomorrow.  Today it is better to start with liquids and gradually work up to solid foods.  You may eat anything you prefer, but it is better to start with liquids, then soup and crackers, and gradually work up to solid foods.   3) Please notify your doctor immediately if you have any unusual bleeding, trouble breathing, redness and pain at the surgery site, drainage, fever, or pain not relieved by medication.

## 2015-10-03 NOTE — Op Note (Signed)
  Operative Note  10/03/2015 2:51 PM  PRE-OP DIAGNOSIS: MISSED AB   POST-OP DIAGNOSIS: same  SURGEON: Annamarie MajorPaul Harris, MD, FACOG  ANESTHESIA: Choice   PROCEDURE: Procedure(s): DILATATION AND CURETTAGE   ESTIMATED BLOOD LOSS: Minimal   SPECIMENS: POC   COMPLICATIONS: none  DISPOSITION: PACU - hemodynamically stable.  CONDITION: stable  FINDINGS: Exam under anesthesia revealed a 10 wk size uterus without palpable adnexal masses.   INDICATION FOR PROCEDURE: Missed abortion at 10 weeks based on serial ultrasounds, no pain or bleeding.  PROCEDURE IN DETAIL: After informed consent was obtained, the patient was taken to the operating room where anesthesia was obtained without difficulty. The patient was positioned in the dorsal lithotomy position with [employer]. Time out was performed and an exam under anesthesia was performed. The vagina, perineum, and lower abdomen were prepped and draped in a normal sterile fashion. The bladder was emptied with an I&O catheter. A speculum was placed into the vagina and the cervix was grasped with a single toothed tenaculum. The uterus was sounded to 11cm.  The cervix was already dilated to 20 [place] with  [facility] dilators. The suction was then tested and found to be adequate, and a 8mm rigid suction cannula was advanced into the uterine cavity. The suction was activated and the contents of the uterus were aspirated until no further tissue was obtained. The uterus was then curetted to gritty texture throughout.  At the end of the procedure bleeding was noted to beSmall.  All instruments were then removed from the vagina.The patient tolerated the procedure well. All sponge, instrument, and needle counts were correct. The patient was taken to the recovery room in good condition.

## 2015-10-03 NOTE — Anesthesia Preprocedure Evaluation (Signed)
Anesthesia Evaluation  Patient identified by MRN, date of birth, ID band Patient awake    Reviewed: Allergy & Precautions, NPO status , Patient's Chart, lab work & pertinent test results  Airway Mallampati: II       Dental  (+) Teeth Intact   Pulmonary asthma , former smoker,    breath sounds clear to auscultation       Cardiovascular Exercise Tolerance: Good  Rhythm:Regular Rate:Normal     Neuro/Psych Seizures -,     GI/Hepatic negative GI ROS, Neg liver ROS,   Endo/Other  negative endocrine ROS  Renal/GU negative Renal ROS     Musculoskeletal negative musculoskeletal ROS (+)   Abdominal Normal abdominal exam  (+)   Peds  Hematology negative hematology ROS (+)   Anesthesia Other Findings   Reproductive/Obstetrics                             Anesthesia Physical Anesthesia Plan  ASA: II  Anesthesia Plan: General   Post-op Pain Management:    Induction: Intravenous  Airway Management Planned: Mask and LMA  Additional Equipment:   Intra-op Plan:   Post-operative Plan: Extubation in OR  Informed Consent: I have reviewed the patients History and Physical, chart, labs and discussed the procedure including the risks, benefits and alternatives for the proposed anesthesia with the patient or authorized representative who has indicated his/her understanding and acceptance.     Plan Discussed with: Anesthesiologist  Anesthesia Plan Comments:         Anesthesia Quick Evaluation

## 2015-10-03 NOTE — H&P (Signed)
History and Physical Interval Note:  10/03/2015 12:48 PM  Miranda King  has presented today for surgery, with the diagnosis of MISSED AB  The various methods of treatment have been discussed with the patient and family. After consideration of risks, benefits and other options for treatment, the patient has consented to  Procedure(s): DILATATION AND EVACUATION (N/A) as a surgical intervention .  The patient's history has been reviewed, patient examined, no change in status, stable for surgery.  Pt has the following beta blocker history-  Not taking Beta Blocker.  I have reviewed the patient's chart and labs.  Questions were answered to the patient's satisfaction.    Letitia Libraobert Paul Carolyne Whitsel

## 2015-10-03 NOTE — Transfer of Care (Signed)
Immediate Anesthesia Transfer of Care Note  Patient: Anely L Rubalcava  Procedure(s) Performed: Procedure(s): DILATATION AND EVACUATION (N/A)  Patient Location: PACU  Anesthesia Type:General  Level of Consciousness: awake, alert , oriented and patient cooperative  Airway & Oxygen Therapy: Patient Spontanous Breathing and Patient connected to nasal cannula oxygen  Post-op Assessment: Report given to RN, Post -op Vital signs reviewed and stable and Patient moving all extremities  Post vital signs: Reviewed and stable  Last Vitals:  Filed Vitals:   10/03/15 1155 10/03/15 1453  BP: 107/66 100/59  Pulse: 57 54  Temp: 36.4 C 36.8 C  Resp: 16 15    Last Pain: There were no vitals filed for this visit.       Complications: No apparent anesthesia complications

## 2015-10-04 ENCOUNTER — Encounter: Payer: Self-pay | Admitting: Obstetrics & Gynecology

## 2015-10-04 NOTE — Anesthesia Postprocedure Evaluation (Signed)
Anesthesia Post Note  Patient: Miranda King  Procedure(s) Performed: Procedure(s) (LRB): DILATATION AND EVACUATION (N/A)  Patient location during evaluation: PACU Anesthesia Type: General Level of consciousness: awake Pain management: satisfactory to patient Vital Signs Assessment: post-procedure vital signs reviewed and stable Respiratory status: nonlabored ventilation Anesthetic complications: no    Last Vitals:  Filed Vitals:   10/03/15 1535 10/03/15 1616  BP: 104/52 118/60  Pulse: 68 66  Temp: 36.7 C   Resp: 14 16    Last Pain:  Filed Vitals:   10/03/15 1636  PainSc: 5                  VAN STAVEREN,Niambi Smoak

## 2015-10-05 LAB — SURGICAL PATHOLOGY

## 2015-10-27 ENCOUNTER — Encounter: Payer: Self-pay | Admitting: Family Medicine

## 2015-10-27 ENCOUNTER — Ambulatory Visit (INDEPENDENT_AMBULATORY_CARE_PROVIDER_SITE_OTHER): Payer: Medicaid Other | Admitting: Family Medicine

## 2015-10-27 ENCOUNTER — Other Ambulatory Visit: Payer: Self-pay | Admitting: Family Medicine

## 2015-10-27 VITALS — BP 111/75 | HR 68 | Temp 98.3°F | Wt 130.0 lb

## 2015-10-27 DIAGNOSIS — O021 Missed abortion: Secondary | ICD-10-CM

## 2015-10-27 DIAGNOSIS — G40909 Epilepsy, unspecified, not intractable, without status epilepticus: Secondary | ICD-10-CM

## 2015-10-27 MED ORDER — HYDROCODONE-ACETAMINOPHEN 5-325 MG PO TABS: 1.0000 | ORAL_TABLET | Freq: Four times a day (QID) | ORAL | Status: DC | PRN

## 2015-10-27 MED ORDER — LIDOCAINE VISCOUS 2 % MT SOLN: 5.0000 mL | OROMUCOSAL | Status: DC | PRN

## 2015-10-27 MED ORDER — CYCLOBENZAPRINE HCL 5 MG PO TABS: 5.0000 mg | ORAL_TABLET | Freq: Three times a day (TID) | ORAL | Status: DC | PRN

## 2015-10-27 NOTE — Progress Notes (Signed)
BP 111/75 mmHg  Pulse 68  Temp(Src) 98.3 F (36.8 C)  Wt 130 lb (58.968 kg)  SpO2 100%  LMP  (LMP Unknown)   Subjective:    Patient ID: Miranda King, female    DOB: 07-17-1995, 20 y.o.   MRN: [id number]  HPI: [name] is a 20 y.o. female  Chief Complaint  Patient presents with  . Hospitalization Follow-up    was seen and treated for a miscarriage. Had a D&C. She goes back to GYN next week.   Pt presents for hospital f/u after having a D and E following a missed abortion. Taking ibuprofen as needed for cramping and is no longer having any vaginal bleeding. Coping well emotionally with the loss. Following up next week with GYN.  Pt also having neck, head, and tongue pain following a seizure Tuesday. Dx with seizure disorder 4 years ago and this is first seizure since. Spoke with neurology and they have discussed medications and plan. Bit her tongue during seizure and has a large gash down right side of tongue that is making it difficult to eat. Feels like there is a really bad crick in her neck and having off and on headaches since the incident as well. Ibuprofen not helping much. Has been getting her boyfriend to massage her neck but still not getting much relief.   Relevant past medical, surgical, family and social history reviewed and updated as indicated. Interim medical history since our last visit reviewed. Allergies and medications reviewed and updated.  Review of Systems  Constitutional: Negative.   HENT:       Tongue pain  Respiratory: Negative.   Cardiovascular: Negative.   Gastrointestinal: Negative.   Genitourinary: Negative.   Musculoskeletal: Positive for neck pain and neck stiffness.  Skin: Negative.   Neurological: Positive for headaches.  Psychiatric/Behavioral: Negative.     Per HPI unless specifically indicated above     Objective:    BP 111/75 mmHg  Pulse 68  Temp(Src) 98.3 F (36.8 C)  Wt 130 lb (58.968 kg)  SpO2 100%  LMP   (LMP Unknown)  Wt Readings from Last 3 Encounters:  10/27/15 130 lb (58.968 kg)  10/03/15 131 lb (59.421 kg)  09/30/15 131 lb (59.421 kg)    Physical Exam  Constitutional: She appears well-developed and well-nourished. No distress.  HENT:  Head: Atraumatic.  Non-infected linear wound extending down right tongue edge  Eyes: Conjunctivae are normal. No scleral icterus.  Neck: Normal range of motion.  ROM intact, but mildly limited by patient discomfort  Cardiovascular: Normal rate, regular rhythm and normal heart sounds.   Pulmonary/Chest: Effort normal and breath sounds normal.  Abdominal: Soft. Bowel sounds are normal. She exhibits no distension. There is no tenderness.  Musculoskeletal: Normal range of motion. She exhibits tenderness (TTP over trapezius b/l). She exhibits no edema.  Skin: Skin is warm and dry.  Psychiatric: She has a normal mood and affect. Her behavior is normal.  Nursing note and vitals reviewed.   Results for orders placed or performed during the hospital encounter of 10/03/15  CBC  Result Value Ref Range   WBC 4.7 3.6 - 11.0 K/uL   RBC 4.20 3.80 - 5.20 MIL/uL   Hemoglobin 12.2 12.0 - 16.0 g/dL   HCT 56.2 13.0 - 86.5 %   MCV 85.3 80.0 - 100.0 fL   MCH 29.0 26.0 - 34.0 pg   MCHC 34.0 32.0 - 36.0 g/dL   RDW 78.4 69.6 - 29.5 %  Platelets 238 150 - 440 K/uL  Type and screen [facility]  Result Value Ref Range   ABO/RH(D) O POS    Antibody Screen NEG    Sample Expiration 10/06/2015   Surgical pathology  Result Value Ref Range   SURGICAL PATHOLOGY      Surgical Pathology CASE: [id number] PATIENT: Miranda King Surgical Pathology Report     SPECIMEN SUBMITTED: A. Products of conception  CLINICAL HISTORY: None provided  PRE-OPERATIVE DIAGNOSIS: Missed AB  POST-OPERATIVE DIAGNOSIS: Same as pre-op     DIAGNOSIS: A. PRODUCTS OF CONCEPTION; DILATATION AND EVACUATION: - DECIDUA AND CHORIONIC VILLI, CONSISTENT  WITH PRODUCTS OF CONCEPTION.   GROSS DESCRIPTION:  A. Labeled: products of conception  Tissue fragment(s): multiple  Size: aggregate, 17.2 x 9.3 x 1.1 cm  Villous tissue: tissue suggestive of villous tissue present  Fetal tissue: no grossly identified  Comment: none noted  Block summary: 1- representative sections  Final Diagnosis performed by Glenice Bowana Baker, MD.  Electronically signed 10/05/2015 3:04:40PM    The electronic signature indicates that the named Attending Pathologist has evaluated the specimen  Technical component performed at [facility], [address], [place], [place]  [address] Lab: [phone] Dir: Titus DubinWilliam F. Cato MulliganHancock, MD  Professional component performed at [facility], [facility], [address], [place], [place] [address] Lab: [phone] Dir: Georgiann Cockerara C. Oneita Krasubinas, MD        Assessment & Plan:   Problem List Items Addressed This Visit      Nervous and Auditory   Seizure disorder [facility](HCC)     Other   Missed abortion - Primary    S/p D and E, doing well. Has f/u next week with GYN. Continue ibuprofen prn   Neurology following. Norco, flexeril, and viscous lidocaine given for symptomatic relief post recent seizure.   Follow up plan: Return if symptoms worsen or fail to improve.

## 2015-10-27 NOTE — Patient Instructions (Addendum)
Follow up if no improvement 

## 2016-09-18 ENCOUNTER — Encounter: Payer: Self-pay | Admitting: Obstetrics and Gynecology

## 2016-09-18 ENCOUNTER — Ambulatory Visit (INDEPENDENT_AMBULATORY_CARE_PROVIDER_SITE_OTHER): Payer: Medicaid Other | Admitting: Obstetrics and Gynecology

## 2016-09-18 VITALS — BP 100/60 | HR 65 | Ht 66.0 in | Wt 124.0 lb

## 2016-09-18 DIAGNOSIS — N921 Excessive and frequent menstruation with irregular cycle: Secondary | ICD-10-CM

## 2016-09-18 DIAGNOSIS — Z3202 Encounter for pregnancy test, result negative: Secondary | ICD-10-CM | POA: Diagnosis not present

## 2016-09-18 DIAGNOSIS — Z30432 Encounter for removal of intrauterine contraceptive device: Secondary | ICD-10-CM

## 2016-09-18 DIAGNOSIS — R102 Pelvic and perineal pain: Secondary | ICD-10-CM | POA: Diagnosis not present

## 2016-09-18 DIAGNOSIS — Z113 Encounter for screening for infections with a predominantly sexual mode of transmission: Secondary | ICD-10-CM | POA: Diagnosis not present

## 2016-09-18 LAB — POCT URINE PREGNANCY: Preg Test, Ur: NEGATIVE

## 2016-09-18 MED ORDER — NORGESTIMATE-ETH ESTRADIOL 0.25-35 MG-MCG PO TABS: 1.0000 | ORAL_TABLET | Freq: Every day | ORAL | 3 refills | Status: DC

## 2016-09-18 NOTE — Progress Notes (Addendum)
   Chief Complaint  Patient presents with  . Contraception    requests IUD removal     History of Present Illness:  Miranda King is a 21 y.o. that had a Skyla IUD placed approximately 1 years ago. Since that time, she has had irregular bleeding, cramping, and pain with sex. She has had a white d/c without itching/irritation/odor. No LBP, fevers. She is sex active.  She doesn't like IUD and would like to go back on OCPs. She did sprintec in the past without any side effects. No hx of migraines/HTN.   BP 100/60   Pulse 65   Ht 5\' 6"  (1.676 m)   Wt 124 lb (56.2 kg)   LMP  (LMP Unknown) Comment: inconsistent spotting with IUD  BMI 20.01 kg/m   Pelvic exam:  Two IUD strings present seen coming from the cervical os. IUD close to cx os when removed. EGBUS, vaginal vault and cervix: within normal limits  IUD Removal Strings of IUD identified and grasped.  IUD removed without problem with ring forceps.  Pt tolerated this well.  IUD noted to be intact.  Results for orders placed or performed in visit on 09/18/16 (from the past 24 hour(s))  POCT urine pregnancy     Status: None   Collection Time: 09/18/16  8:51 AM  Result Value Ref Range   Preg Test, Ur Negative Negative   Assessment/Plan:   Encounter for removal of intrauterine contraceptive device (IUD) - IUD removed. Pt tolerated well. OCP start today. Condoms for 1 mo. - Plan: norgestimate-ethinyl estradiol (ORTHO-CYCLEN,SPRINTEC,PREVIFEM) 0.25-35 MG-MCG tablet  Irregular intermenstrual bleeding - Neg UPT. Most likely related to IUD. Check gon/chlam screen. F/u prn. - Plan: POCT urine pregnancy  Screening for STD (sexually transmitted disease) - Plan: Chlamydia/Gonococcus/Trichomonas, NAA  Pelvic pain - Neg exam. Check gon/chlam. If neg, see if sx improve with IUD rem. F/u in 3 months at annual/sooner prn.   Panagiotis Oelkers B. Tsuneo Faison, PA-C 09/18/2016 10:00 AM

## 2016-09-21 LAB — CHLAMYDIA/GONOCOCCUS/TRICHOMONAS, NAA
Chlamydia by NAA: NEGATIVE
GONOCOCCUS BY NAA: NEGATIVE
TRICH VAG BY NAA: NEGATIVE

## 2017-01-06 ENCOUNTER — Encounter: Payer: Self-pay | Admitting: Advanced Practice Midwife

## 2017-01-13 LAB — OB RESULTS CONSOLE GC/CHLAMYDIA
CHLAMYDIA, DNA PROBE: NEGATIVE
Gonorrhea: NEGATIVE

## 2017-02-19 LAB — SICKLE CELL SCREEN: Sickle Cell Screen: NEGATIVE

## 2017-02-19 LAB — OB RESULTS CONSOLE RUBELLA ANTIBODY, IGM: RUBELLA: IMMUNE

## 2017-02-19 LAB — OB RESULTS CONSOLE HIV ANTIBODY (ROUTINE TESTING): HIV: NONREACTIVE

## 2017-02-19 LAB — OB RESULTS CONSOLE RPR: RPR: NONREACTIVE

## 2017-02-19 LAB — OB RESULTS CONSOLE HEPATITIS B SURFACE ANTIGEN: HEP B S AG: NEGATIVE

## 2017-03-28 ENCOUNTER — Encounter: Payer: Self-pay | Admitting: Emergency Medicine

## 2017-03-28 ENCOUNTER — Emergency Department
Admission: EM | Admit: 2017-03-28 | Discharge: 2017-03-28 | Disposition: A | Discharge status: Home / Self Care | Payer: Medicaid Other | Attending: Student in an Organized Health Care Education/Training Program | Admitting: Student in an Organized Health Care Education/Training Program

## 2017-03-28 ENCOUNTER — Other Ambulatory Visit: Payer: Self-pay

## 2017-03-28 DIAGNOSIS — O99512 Diseases of the respiratory system complicating pregnancy, second trimester: Secondary | ICD-10-CM | POA: Diagnosis not present

## 2017-03-28 DIAGNOSIS — R569 Unspecified convulsions: Secondary | ICD-10-CM | POA: Diagnosis not present

## 2017-03-28 DIAGNOSIS — O99332 Smoking (tobacco) complicating pregnancy, second trimester: Secondary | ICD-10-CM | POA: Diagnosis not present

## 2017-03-28 DIAGNOSIS — Z3A16 16 weeks gestation of pregnancy: Secondary | ICD-10-CM | POA: Diagnosis not present

## 2017-03-28 DIAGNOSIS — Z79899 Other long term (current) drug therapy: Secondary | ICD-10-CM | POA: Insufficient documentation

## 2017-03-28 DIAGNOSIS — F17200 Nicotine dependence, unspecified, uncomplicated: Secondary | ICD-10-CM | POA: Diagnosis not present

## 2017-03-28 DIAGNOSIS — G40909 Epilepsy, unspecified, not intractable, without status epilepticus: Secondary | ICD-10-CM | POA: Insufficient documentation

## 2017-03-28 DIAGNOSIS — J45909 Unspecified asthma, uncomplicated: Secondary | ICD-10-CM | POA: Insufficient documentation

## 2017-03-28 DIAGNOSIS — O99352 Diseases of the nervous system complicating pregnancy, second trimester: Secondary | ICD-10-CM | POA: Insufficient documentation

## 2017-03-28 DIAGNOSIS — R55 Syncope and collapse: Secondary | ICD-10-CM | POA: Diagnosis present

## 2017-03-28 LAB — BASIC METABOLIC PANEL
ANION GAP: 6 (ref 5–15)
BUN: 7 mg/dL (ref 6–20)
CO2: 21 mmol/L — ABNORMAL LOW (ref 22–32)
Calcium: 9 mg/dL (ref 8.9–10.3)
Chloride: 106 mmol/L (ref 101–111)
Creatinine, Ser: 0.47 mg/dL (ref 0.44–1.00)
GFR calc non Af Amer: >60 mL/min (ref >60)
GLUCOSE: 77 mg/dL (ref 65–99)
POTASSIUM: 3.9 mmol/L (ref 3.5–5.1)
SODIUM: 133 mmol/L — AB (ref 135–145)

## 2017-03-28 LAB — URINALYSIS, COMPLETE (UACMP) WITH MICROSCOPIC
BACTERIA UA: NONE SEEN
BILIRUBIN URINE: NEGATIVE
GLUCOSE, UA: NEGATIVE mg/dL
Hgb urine dipstick: NEGATIVE
Ketones, ur: NEGATIVE mg/dL
LEUKOCYTES UA: NEGATIVE
Nitrite: NEGATIVE
PROTEIN: NEGATIVE mg/dL
Specific Gravity, Urine: 1.006 (ref 1.005–1.030)
pH: 6 (ref 5.0–8.0)

## 2017-03-28 LAB — CBC
HEMATOCRIT: 34.3 % — AB (ref 35.0–47.0)
HEMOGLOBIN: 11.7 g/dL — AB (ref 12.0–16.0)
MCH: 30.1 pg (ref 26.0–34.0)
MCHC: 34.2 g/dL (ref 32.0–36.0)
MCV: 88 fL (ref 80.0–100.0)
Platelets: 245 10*3/uL (ref 150–440)
RBC: 3.89 MIL/uL (ref 3.80–5.20)
RDW: 14.1 % (ref 11.5–14.5)
WBC: 7.8 10*3/uL (ref 3.6–11.0)

## 2017-03-28 MED ORDER — ALBUTEROL SULFATE HFA 108 (90 BASE) MCG/ACT IN AERS: 2.0000 | INHALATION_SPRAY | Freq: Four times a day (QID) | RESPIRATORY_TRACT | 2 refills | Status: DC | PRN

## 2017-03-28 MED ORDER — ACETAMINOPHEN 500 MG PO TABS
1000.0000 mg | ORAL_TABLET | Freq: Once | ORAL | Status: AC
  Administered 2017-03-28: 1000 mg via ORAL
  Filled 2017-03-28: qty 2

## 2017-03-28 MED ORDER — SODIUM CHLORIDE 0.9 % IV BOLUS (SEPSIS)
1000.0000 mL | Freq: Once | INTRAVENOUS | Status: AC
  Administered 2017-03-28: 1000 mL via INTRAVENOUS

## 2017-03-28 MED ORDER — FERROUS SULFATE 325 (65 FE) MG PO TABS: 325.0000 mg | ORAL_TABLET | Freq: Every day | ORAL | 0 refills | Status: DC

## 2017-03-28 NOTE — ED Notes (Signed)
To Room 4 Angela RN aware.

## 2017-03-28 NOTE — ED Notes (Signed)
[facility].

## 2017-03-28 NOTE — ED Notes (Addendum)
First Nurse Note:  Report from EMS/ Paramedic Teer.  Sitting on sofa upon arrival of EMS, reportedly cooking and "vision went black".  Near syncopal episode.  4 mos. Pregnant - due date 08/29/17.  CBG - 104 at scene.  Takes pre-natal vitamins.  Sees [facility].  Hx of seizures.  Alert and oriented, sitting in WC.  No reports of recent illness.  Person at the residence told Paramedic Teer that patient had a similar episode a year ago.  Pt. States that she is tired.  20 Ga in Left AC.

## 2017-03-28 NOTE — Discharge Instructions (Signed)
Follow-up with your neurologist as soon as possible.  Be sure to drink plenty of fluids.  Return to the ER should you have any worsening symptoms or for any other concerns or questions.

## 2017-03-28 NOTE — ED Triage Notes (Signed)
Patient arrives via [facility].  Patient is tearful in triage and states "I'm just tired".  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

## 2017-03-28 NOTE — ED Provider Notes (Signed)
[facility] Emergency Department Provider Note    First MD Initiated Contact with Patient 03/28/17 1057     (approximate)  I have reviewed the triage vital signs and the nursing notes.   HISTORY  Chief Complaint No chief complaint on file.    HPI Miranda King is a 21 y.o. female 913P1011 who is 4 months pregnant presents with near syncopal episode.  This occurred while the patient was cooking breakfast.  She was at home with her brother.  States that she started seeing black spots in her vision and then felt like she was about to pass out.  She was helped to the chair where that she started making brief seizure-like motions with her eyes rolling back.  Reportedly did not fully lose consciousness.  Brother called EMS who brought her to the ER for evaluation that she feels "tired" and does not want to be here.  Is frustrated that she is missing an appointment with her counselor at school to enroll in school next semester.  States she has a mild headache.  Denies any numbness or tingling.  No nausea or vomiting.  Mother at bedside says she has not been getting much sleep recently.  Does have a previous history of seizures and is Keppra in over 6 months because she did not like the way it made her feel.    Past Medical History:  Diagnosis Date  . Asthma   . Seizures (HCC)   . Tobacco use disorder    Family History  Problem Relation Age of Onset  . Migraines Mother   . Asthma Sister   . Hypertension Maternal Grandmother   . Diabetes Maternal Grandmother    Past Surgical History:  Procedure Laterality Date  . DILATION AND EVACUATION N/A 10/03/2015   Procedure: DILATATION AND EVACUATION;  Surgeon: Nadara Mustardobert P Harris, MD;  Location: [facility] ORS;  Service: Gynecology;  Laterality: N/A;  . VAGINAL DELIVERY     Patient Active Problem List   Diagnosis Date Noted  . Missed abortion 10/03/2015  . Seizure disorder (HCC) 10/04/2014  . Asthma 09/30/2014  .  Tobacco use disorder 09/30/2014  . Contraception management 09/30/2014      Prior to Admission medications   Medication Sig Start Date End Date Taking? Authorizing Provider  albuterol (PROVENTIL HFA;VENTOLIN HFA) 108 (90 BASE) MCG/ACT inhaler Inhale 2 puffs into the lungs every 6 (six) hours as needed for wheezing or shortness of breath. 10/04/14   Gabriel CirriWicker, Cheryl, NP  ferrous sulfate 325 (65 FE) MG tablet Take by mouth.    [provider]  IRON PO Take by mouth.    [provider]  norgestimate-ethinyl estradiol (ORTHO-CYCLEN,SPRINTEC,PREVIFEM) 0.25-35 MG-MCG tablet Take 1 tablet by mouth daily. 09/18/16   Copland, Ilona SorrelAlicia B, PA-C    Allergies Coconut oil and Tramadol hcl    Social History Social History   Tobacco Use  . Smoking status: Current Some Day Smoker  . Smokeless tobacco: Never Used  Substance Use Topics  . Alcohol use: No    Alcohol/week: 0.0 oz  . Drug use: No    Review of Systems Patient denies headaches, rhinorrhea, blurry vision, numbness, shortness of breath, chest pain, edema, cough, abdominal pain, nausea, vomiting, diarrhea, dysuria, fevers, rashes or hallucinations unless otherwise stated above in HPI. ____________________________________________   PHYSICAL EXAM:  VITAL SIGNS: Vitals:   03/28/17 1108  BP: 108/60  Pulse: 67  Resp: 16  Temp: 98.6 F (37 C)  SpO2: 100%  Constitutional: Alert and oriented.  in no acute distress. Eyes: Conjunctivae are normal.  Head: Atraumatic. Nose: No congestion/rhinnorhea. Mouth/Throat: Mucous membranes are moist.   Neck: No stridor. Painless ROM.  Cardiovascular: Normal rate, regular rhythm. Grossly normal heart sounds.  Good peripheral circulation. Respiratory: Normal respiratory effort.  No retractions. Lungs CTAB. Gastrointestinal: Soft and nontender. No distention. No abdominal bruits. No CVA tenderness. Genitourinary: deferred Musculoskeletal: No lower extremity tenderness nor edema.   No joint effusions. Neurologic:  Normal speech and language. No gross focal neurologic deficits are appreciated. No facial droop Skin:  Skin is warm, dry and intact. No rash noted. Psychiatric: Mood and affect are normal. Speech and behavior are normal.  ____________________________________________   LABS (all labs ordered are listed, but only abnormal results are displayed)  Results for orders placed or performed during the hospital encounter of 03/28/17 (from the past 24 hour(s))  Basic metabolic panel     Status: Abnormal   Collection Time: 03/28/17 10:43 AM  Result Value Ref Range   Sodium 133 (L) 135 - 145 mmol/L   Potassium 3.9 3.5 - 5.1 mmol/L   Chloride 106 101 - 111 mmol/L   CO2 21 (L) 22 - 32 mmol/L   Glucose, Bld 77 65 - 99 mg/dL   BUN 7 6 - 20 mg/dL   Creatinine, Ser 6.600.47 0.44 - 1.00 mg/dL   Calcium 9.0 8.9 - 63.010.3 mg/dL   GFR calc non Af Amer >60 >60 mL/min   GFR calc Af Amer >60 >60 mL/min   Anion gap 6 5 - 15  CBC     Status: Abnormal   Collection Time: 03/28/17 10:43 AM  Result Value Ref Range   WBC 7.8 3.6 - 11.0 K/uL   RBC 3.89 3.80 - 5.20 MIL/uL   Hemoglobin 11.7 (L) 12.0 - 16.0 g/dL   HCT 16.034.3 (L) 10.935.0 - 32.347.0 %   MCV 88.0 80.0 - 100.0 fL   MCH 30.1 26.0 - 34.0 pg   MCHC 34.2 32.0 - 36.0 g/dL   RDW 55.714.1 32.211.5 - 02.514.5 %   Platelets 245 150 - 440 K/uL  Urinalysis, Complete w Microscopic     Status: Abnormal   Collection Time: 03/28/17 11:08 AM  Result Value Ref Range   Color, Urine YELLOW (A) YELLOW   APPearance CLEAR (A) CLEAR   Specific Gravity, Urine 1.006 1.005 - 1.030   pH 6.0 5.0 - 8.0   Glucose, UA NEGATIVE NEGATIVE mg/dL   Hgb urine dipstick NEGATIVE NEGATIVE   Bilirubin Urine NEGATIVE NEGATIVE   Ketones, ur NEGATIVE NEGATIVE mg/dL   Protein, ur NEGATIVE NEGATIVE mg/dL   Nitrite NEGATIVE NEGATIVE   Leukocytes, UA NEGATIVE NEGATIVE   RBC / HPF 0-5 0 - 5 RBC/hpf   WBC, UA 0-5 0 - 5 WBC/hpf   Bacteria, UA NONE SEEN NONE SEEN   Squamous  Epithelial / LPF 0-5 (A) NONE SEEN   Mucus PRESENT    ____________________________________________  EKG My review and personal interpretation at Time: 10:47   Indication: near syncope  Rate: 80  Rhythm: sinsu Axis: normal Other: normal intervals, no brugada or wpw ____________________________________________  RADIOLOGY   ____________________________________________   PROCEDURES  Procedure(s) performed:  Procedures    Critical Care performed: no ____________________________________________   INITIAL IMPRESSION / ASSESSMENT AND PLAN / ED COURSE  Pertinent labs & imaging results that were available during my care of the patient were reviewed by me and considered in my medical decision making (see chart for details).  DDX: seizure, dehydration, vasovagal, electrolyte abn, eclampsia   Miranda King is a 21 y.o. who presents to the ED with near syncopal episode as described above.  Patient is in no acute distress.  No focal deficits.  Blood work check for the above differential shows no acute abnormality.  Possible mild dehydration for which she was given IV fluids.  No evidence of infectious process on exam.  Patient denies any bleeding or vaginal discharge.  No dysuria. No evidence of pre-E. No evidence of dysrhythmia on EKG.  Patient able to ambulate with a steady gait.  Patient stable and appropriate for further workup as an outpatient.      ____________________________________________   FINAL CLINICAL IMPRESSION(S) / ED DIAGNOSES  Final diagnoses:  Seizure-like activity (HCC)  [redacted] weeks gestation of pregnancy      NEW MEDICATIONS STARTED DURING THIS VISIT:  This SmartLink is deprecated. Use AVSMEDLIST instead to display the medication list for a patient.   Note:  This document was prepared using Dragon voice recognition software and may include unintentional dictation errors.    Willy Eddy, MD 03/28/17 [id number]

## 2017-04-15 NOTE — L&D Delivery Note (Signed)
Date of delivery: 08/23/2017 Estimated Date of Delivery: 08/30/17 Patient's last menstrual period was 11/23/2016. EGA: [redacted]w[redacted]d  Delivery Note At 12:37 AM a viable female was delivered via Vaginal, Spontaneous (Presentation: OA;  ROA).   APGAR: 8, 9; weight: 3190 g     Placenta status: spontaneous, intact.  Cord:  with the following complications: none.  Cord pH: NA  Called to see patient.  Mom pushed to deliver a viable female infant.  The head followed by shoulders, which delivered without difficulty, and the rest of the body.  Nuchal/body cord noted which reduced easily following birth of baby.  Baby to mom's chest.  Cord clamped and cut after 3 min delay.  Cord blood obtained.  Placenta delivered spontaneously, intact, with a 3-vessel cord.  All counts correct.  Hemostasis obtained with IV pitocin and fundal massage.   Anesthesia: epidural Episiotomy: none  Lacerations: none  Suture Repair: NA Est. Blood Loss (mL):  150  Mom to postpartum.  Baby to Couplet care / Skin to Skin.  Tresea Mall, CNM 08/23/2017, 1:03 AM

## 2017-05-23 ENCOUNTER — Encounter: Payer: Self-pay | Admitting: Obstetrics and Gynecology

## 2017-05-26 ENCOUNTER — Ambulatory Visit (INDEPENDENT_AMBULATORY_CARE_PROVIDER_SITE_OTHER): Payer: Medicaid Other | Admitting: Obstetrics and Gynecology

## 2017-05-26 ENCOUNTER — Encounter: Payer: Self-pay | Admitting: Obstetrics and Gynecology

## 2017-05-26 VITALS — BP 98/56 | HR 94 | Wt 141.0 lb

## 2017-05-26 DIAGNOSIS — Z3A26 26 weeks gestation of pregnancy: Secondary | ICD-10-CM

## 2017-05-26 DIAGNOSIS — Z3480 Encounter for supervision of other normal pregnancy, unspecified trimester: Secondary | ICD-10-CM

## 2017-05-26 DIAGNOSIS — J45909 Unspecified asthma, uncomplicated: Secondary | ICD-10-CM

## 2017-05-26 DIAGNOSIS — O0932 Supervision of pregnancy with insufficient antenatal care, second trimester: Secondary | ICD-10-CM

## 2017-05-26 NOTE — Progress Notes (Signed)
NOB Transfer from [facility]   05/27/2017   Chief Complaint: Missed period  Transfer of Care Patient: yes  History of Present Illness: Miranda King is a 22 y.o. G3P1011 [redacted]w[redacted]d based on Patient's last menstrual period was 11/23/2016. with an Estimated Date of Delivery: 08/30/17, with the above CC.   Her periods were: last menstrual period 11/23/2016 She was using OCPs when she conceived.  She has Negative signs or symptoms of nausea/vomiting of pregnancy. She has Negative signs or symptoms of miscarriage or preterm labor She identifies Negative Zika risk factors for her and her partner On any different medications around the time she conceived/early pregnancy: No  History of varicella: Yes   ROS: A 12-point review of systems was performed and negative, except as stated in the above HPI.  OBGYN History: As per HPI. OB History  Gravida Para Term Preterm AB Living  3 1 1   1 1   SAB TAB Ectopic Multiple Live Births    1     1    # Outcome Date GA Lbr Len/2nd Weight Sex Delivery Anes PTL Lv  3 Current           2 Term 07/05/12    F Vag-Spont     1 TAB               Any issues with any prior pregnancies: no Any prior children are healthy, doing well, without any problems or issues: yes History of pap smears: Yes. Last pap smear Ocotober 2018 ASCUS HPV+, June 2017 ASCUS HPV+ History of STIs: No   Past Medical History: Past Medical History:  Diagnosis Date  . Asthma   . Seizures (HCC)   . Tobacco use disorder     Past Surgical History: Past Surgical History:  Procedure Laterality Date  . DILATION AND EVACUATION N/A 10/03/2015   Procedure: DILATATION AND EVACUATION;  Surgeon: Nadara Mustard, MD;  Location: [facility] ORS;  Service: Gynecology;  Laterality: N/A;  . VAGINAL DELIVERY      Family History:  Family History  Problem Relation Age of Onset  . Migraines Mother   . Asthma Sister   . Hypertension Maternal Grandmother   . Diabetes Maternal Grandmother     She denies any female cancers, bleeding or blood clotting disorders.  She denies any history of mental retardation, birth defects or genetic disorders in her or the FOB's history  Social History:  Social History   Socioeconomic History  . Marital status: Single    Spouse name: Not on file  . Number of children: Not on file  . Years of education: Not on file  . Highest education level: Not on file  Social Needs  . Financial resource strain: Not on file  . Food insecurity - worry: Not on file  . Food insecurity - inability: Not on file  . Transportation needs - medical: Not on file  . Transportation needs - non-medical: Not on file  Occupational History  . Not on file  Tobacco Use  . Smoking status: Current Some Day Smoker  . Smokeless tobacco: Never Used  Substance and Sexual Activity  . Alcohol use: No    Alcohol/week: 0.0 oz  . Drug use: No  . Sexual activity: Yes    Birth control/protection: IUD  Other Topics Concern  . Not on file  Social History Narrative  . Not on file   Any pets in the household: yes 2 dogs, no cats, counseled on risks  of changing liter.   Allergy: Allergies  Allergen Reactions  . Coconut Oil Anaphylaxis  . Tramadol Hcl Nausea And Vomiting    Current Outpatient Medications:  Current Outpatient Medications:  .  albuterol (PROVENTIL HFA;VENTOLIN HFA) 108 (90 Base) MCG/ACT inhaler, Inhale 2 puffs into the lungs every 6 (six) hours as needed for wheezing or shortness of breath., Disp: 1 Inhaler, Rfl: 2 .  ferrous sulfate 325 (65 FE) MG tablet, Take 1 tablet (325 mg total) by mouth daily with breakfast., Disp: 30 tablet, Rfl: 0 .  norgestimate-ethinyl estradiol (ORTHO-CYCLEN,SPRINTEC,PREVIFEM) 0.25-35 MG-MCG tablet, Take 1 tablet by mouth daily. (Patient not taking: Reported on 05/26/2017), Disp: 28 tablet, Rfl: 3   Physical Exam:   BP (!) 98/56   Pulse 94   Wt 141 lb (64 kg)   LMP 11/23/2016 Comment: inconsistent spotting with IUD  BMI  23.46 kg/m  Body mass index is 23.46 kg/m. Constitutional: Well nourished, well developed female in no acute distress.  Neck:  Supple, normal appearance, and no thyromegaly  Cardiovascular: S1, S2 normal, no murmur, rub or gallop, regular rate and rhythm Respiratory:  Clear to auscultation bilateral. Normal respiratory effort Abdomen: positive bowel sounds and no masses, hernias; diffusely non tender to palpation, non distended Breasts: breasts appear normal, no suspicious masses, no skin or nipple changes or axillary nodes. Neuro/Psych:  Normal mood and affect.  Skin:  Warm and dry.  Lymphatic:  No inguinal lymphadenopathy.   Pelvic exam: deferred. Fundal height 25cm. FHR 140s  Transfer records reviewed. Prenatal labs all normal. No varicella IgG titer, will drawl with 28 week labs. In records patient only had 2 prenatal visits. No anatomy scan.   Assessment: Miranda King is a 22 y.o. G3P1011 5546w3d based on Patient's last menstrual period was 11/23/2016. with an Estimated Date of Delivery: 08/30/17,  for prenatal care.  Plan:  1) Avoid alcoholic beverages. 2) Patient encouraged not to smoke.  3) Discontinue the use of all non-medicinal drugs and chemicals.  4) Take prenatal vitamins daily.  5) Seatbelt use advised 6) Nutrition, food safety (fish, cheese advisories, and high nitrite foods) and exercise discussed. 7) Hospital and practice style [facility] discussed  8) Patient is asked about travel to areas at risk for the Zika virus, and counseled to avoid travel and exposure to mosquitoes or sexual partners who may have themselves been exposed to the virus. Testing is discussed, and will be ordered as appropriate.  9) [facility] advised 10) Genetic Screening, such as with 1st Trimester Screening, cell free fetal DNA, AFP testing, and Ultrasound, as well as with amniocentesis and CVS as appropriate, is discussed with patient. She plans to have genetic testing  this pregnancy.: Quad screen normal.  11) Varicella titers with 28 wk labs 12) Return for anatomy scan ASAP 13) Pap smear will need to be repeated October 2019.   Problem list reviewed and updated.  Adelene Idlerhristanna Schuman MD [facility], [facility] 05/27/2017  12:52 PM

## 2017-05-27 ENCOUNTER — Encounter: Payer: Self-pay | Admitting: Obstetrics and Gynecology

## 2017-05-27 DIAGNOSIS — O0932 Supervision of pregnancy with insufficient antenatal care, second trimester: Secondary | ICD-10-CM | POA: Insufficient documentation

## 2017-05-27 DIAGNOSIS — O0993 Supervision of high risk pregnancy, unspecified, third trimester: Secondary | ICD-10-CM | POA: Insufficient documentation

## 2017-05-27 MED ORDER — PRENATAL VITAMINS 0.8 MG PO TABS: 1.0000 | ORAL_TABLET | Freq: Every day | ORAL | 12 refills | Status: DC

## 2017-05-29 ENCOUNTER — Encounter: Payer: Self-pay | Admitting: Advanced Practice Midwife

## 2017-05-29 ENCOUNTER — Ambulatory Visit (INDEPENDENT_AMBULATORY_CARE_PROVIDER_SITE_OTHER): Payer: Medicaid Other | Admitting: Advanced Practice Midwife

## 2017-05-29 ENCOUNTER — Ambulatory Visit (INDEPENDENT_AMBULATORY_CARE_PROVIDER_SITE_OTHER): Payer: Medicaid Other

## 2017-05-29 VITALS — BP 104/62 | Wt 145.0 lb

## 2017-05-29 DIAGNOSIS — Z3480 Encounter for supervision of other normal pregnancy, unspecified trimester: Secondary | ICD-10-CM

## 2017-05-29 DIAGNOSIS — Z3A26 26 weeks gestation of pregnancy: Secondary | ICD-10-CM

## 2017-05-29 DIAGNOSIS — Z131 Encounter for screening for diabetes mellitus: Secondary | ICD-10-CM

## 2017-05-29 DIAGNOSIS — Z0489 Encounter for examination and observation for other specified reasons: Secondary | ICD-10-CM

## 2017-05-29 DIAGNOSIS — Z113 Encounter for screening for infections with a predominantly sexual mode of transmission: Secondary | ICD-10-CM

## 2017-05-29 DIAGNOSIS — Z130 Encounter for screening for diseases of the blood and blood-forming organs and certain disorders involving the immune mechanism: Secondary | ICD-10-CM

## 2017-05-29 DIAGNOSIS — IMO0002 Reserved for concepts with insufficient information to code with codable children: Secondary | ICD-10-CM

## 2017-05-29 MED ORDER — ONE-A-DAY WOMENS PRENATAL 1 28-0.8-235 MG PO CAPS: 1.0000 | ORAL_CAPSULE | Freq: Every day | ORAL | 6 refills | Status: DC

## 2017-05-29 NOTE — Progress Notes (Signed)
No vb. No lof. Anatomy scan 

## 2017-05-29 NOTE — Progress Notes (Signed)
  Routine Prenatal Care Visit  Subjective  Miranda King is a 22 y.o. G3P1011 at 2033w5d being seen today for ongoing prenatal care.  She is currently monitored for the following issues for this low-risk pregnancy and has Asthma; Tobacco use disorder; Seizure disorder (HCC); Missed abortion; Supervision of other normal pregnancy, antepartum; Limited prenatal care in second trimester; and History of tattoo on their problem list.  ----------------------------------------------------------------------------------- Patient reports no complaints.   Contractions: Not present. Vag. Bleeding: None.  Movement: Present. Denies leaking of fluid.  ----------------------------------------------------------------------------------- The following portions of the patient's history were reviewed and updated as appropriate: allergies, current medications, past family history, past medical history, past social history, past surgical history and problem list. Problem list updated.   Objective  Blood pressure 104/62, weight 145 lb (65.8 kg), last menstrual period 11/23/2016. Pregravid weight 115 lb (52.2 kg) Total Weight Gain 30 lb (13.6 kg) Urinalysis:      Fetal Status: Fetal Heart Rate (bpm): 176   Movement: Present     General:  Alert, oriented and cooperative. Patient is in no acute distress.  Skin: Skin is warm and dry. No rash noted.   Cardiovascular: Normal heart rate noted  Respiratory: Normal respiratory effort, no problems with respiration noted  Abdomen: Soft, gravid, appropriate for gestational age. Pain/Pressure: Absent     Pelvic:  Cervical exam deferred        Extremities: Normal range of motion.  Edema: None  Mental Status: Normal mood and affect. Normal behavior. Normal judgment and thought content.   Assessment   22 y.o. G3P1011 at 6733w5d by  08/30/2017, by Last Menstrual Period presenting for routine prenatal visit  Plan   Pregnancy #3 Problems (from 05/26/17 to present)    Problem Noted Resolved   Supervision of other normal pregnancy, antepartum 05/27/2017 by Natale MilchSchuman, Christanna R, MD No   Overview Addendum 05/27/2017  1:10 PM by Natale MilchSchuman, Christanna R, MD      Clinic [facility] Prenatal Labs  Dating  LMP= 7wk US Blood type:   O Positive  Genetic Screen AFP: 0.74     Quad: Negative, see medical records     Antibody:  negative`  Anatomic US  Rubella: Immune (11/07 0000) Varicella: Unknown  GTT Early:        28 wk:      RPR: Nonreactive (11/07 0000)   Rhogam  HBsAg: Negative (11/07 0000)   TDaP vaccine                       HIV: Non-reactive (11/07 0000)   Flu Shot October 2018 per patient                           GBS:   Contraception  IUD ZOX:WRUEAPap:ASCUS HPV+  CBB     CS/VBAC  not applicable   Baby Food  Breast/Bottle   Support Person             Limited prenatal care in second trimester 05/27/2017 by Natale MilchSchuman, Christanna R, MD No       Preterm labor symptoms and general obstetric precautions including but not limited to vaginal bleeding, contractions, leaking of fluid and fetal movement were reviewed in detail with the patient. Please refer to After Visit Summary for other counseling recommendations.   Return in about 2 weeks (around 06/12/2017) for 28 wk labs and f/u anatomy scan.  Tresea MallJane Kadience Macchi, CNM  05/29/2017 2:52 PM

## 2017-05-29 NOTE — Patient Instructions (Signed)
Third Trimester of Pregnancy The third trimester is from week 28 through week 40 (months 7 through 9). The third trimester is a time when the unborn baby (fetus) is growing rapidly. At the end of the ninth month, the fetus is about 20 inches in length and weighs 6-10 pounds. Body changes during your third trimester Your body will continue to go through many changes during pregnancy. The changes vary from woman to woman. During the third trimester:  Your weight will continue to increase. You can expect to gain 25-35 pounds (11-16 kg) by the end of the pregnancy.  You may begin to get stretch marks on your hips, abdomen, and breasts.  You may urinate more often because the fetus is moving lower into your pelvis and pressing on your bladder.  You may develop or continue to have heartburn. This is caused by increased hormones that slow down muscles in the digestive tract.  You may develop or continue to have constipation because increased hormones slow digestion and cause the muscles that push waste through your intestines to relax.  You may develop hemorrhoids. These are swollen veins (varicose veins) in the rectum that can itch or be painful.  You may develop swollen, bulging veins (varicose veins) in your legs.  You may have increased body aches in the pelvis, back, or thighs. This is due to weight gain and increased hormones that are relaxing your joints.  You may have changes in your hair. These can include thickening of your hair, rapid growth, and changes in texture. Some women also have hair loss during or after pregnancy, or hair that feels dry or thin. Your hair will most likely return to normal after your baby is born.  Your breasts will continue to grow and they will continue to become tender. A yellow fluid (colostrum) may leak from your breasts. This is the first milk you are producing for your baby.  Your belly button may stick out.  You may notice more swelling in your hands,  face, or ankles.  You may have increased tingling or numbness in your hands, arms, and legs. The skin on your belly may also feel numb.  You may feel short of breath because of your expanding uterus.  You may have more problems sleeping. This can be caused by the size of your belly, increased need to urinate, and an increase in your body's metabolism.  You may notice the fetus "dropping," or moving lower in your abdomen (lightening).  You may have increased vaginal discharge.  You may notice your joints feel loose and you may have pain around your pelvic bone.  What to expect at prenatal visits You will have prenatal exams every 2 weeks until week 36. Then you will have weekly prenatal exams. During a routine prenatal visit:  You will be weighed to make sure you and the baby are growing normally.  Your blood pressure will be taken.  Your abdomen will be measured to track your baby's growth.  The fetal heartbeat will be listened to.  Any test results from the previous visit will be discussed.  You may have a cervical check near your due date to see if your cervix has softened or thinned (effaced).  You will be tested for Group B streptococcus. This happens between 35 and 37 weeks.  Your health care provider may ask you:  What your birth plan is.  How you are feeling.  If you are feeling the baby move.  If you have had   any abnormal symptoms, such as leaking fluid, bleeding, severe headaches, or abdominal cramping.  If you are using any tobacco products, including cigarettes, chewing tobacco, and electronic cigarettes.  If you have any questions.  Other tests or screenings that may be performed during your third trimester include:  Blood tests that check for low iron levels (anemia).  Fetal testing to check the health, activity level, and growth of the fetus. Testing is done if you have certain medical conditions or if there are problems during the  pregnancy.  Nonstress test (NST). This test checks the health of your baby to make sure there are no signs of problems, such as the baby not getting enough oxygen. During this test, a belt is placed around your belly. The baby is made to move, and its heart rate is monitored during movement.  What is false labor? False labor is a condition in which you feel small, irregular tightenings of the muscles in the womb (contractions) that usually go away with rest, changing position, or drinking water. These are called Braxton Hicks contractions. Contractions may last for hours, days, or even weeks before true labor sets in. If contractions come at regular intervals, become more frequent, increase in intensity, or become painful, you should see your health care provider. What are the signs of labor?  Abdominal cramps.  Regular contractions that start at 10 minutes apart and become stronger and more frequent with time.  Contractions that start on the top of the uterus and spread down to the lower abdomen and back.  Increased pelvic pressure and dull back pain.  A watery or bloody mucus discharge that comes from the vagina.  Leaking of amniotic fluid. This is also known as your "water breaking." It could be a slow trickle or a gush. Let your health care provider know if it has a color or strange odor. If you have any of these signs, call your health care provider right away, even if it is before your due date. Follow these instructions at home: Medicines  Follow your health care provider's instructions regarding medicine use. Specific medicines may be either safe or unsafe to take during pregnancy.  Take a prenatal vitamin that contains at least 600 micrograms (mcg) of folic acid.  If you develop constipation, try taking a stool softener if your health care provider approves. Eating and drinking  Eat a balanced diet that includes fresh fruits and vegetables, whole grains, good sources of protein  such as meat, eggs, or tofu, and low-fat dairy. Your health care provider will help you determine the amount of weight gain that is right for you.  Avoid raw meat and uncooked cheese. These carry germs that can cause birth defects in the baby.  If you have low calcium intake from food, talk to your health care provider about whether you should take a daily calcium supplement.  Eat four or five small meals rather than three large meals a day.  Limit foods that are high in fat and processed sugars, such as fried and sweet foods.  To prevent constipation: ? Drink enough fluid to keep your urine clear or pale yellow. ? Eat foods that are high in fiber, such as fresh fruits and vegetables, whole grains, and beans. Activity  Exercise only as directed by your health care provider. Most women can continue their usual exercise routine during pregnancy. Try to exercise for 30 minutes at least 5 days a week. Stop exercising if you experience uterine contractions.  Avoid heavy   lifting.  Do not exercise in extreme heat or humidity, or at high altitudes.  Wear low-heel, comfortable shoes.  Practice good posture.  You may continue to have sex unless your health care provider tells you otherwise. Relieving pain and discomfort  Take frequent breaks and rest with your legs elevated if you have leg cramps or low back pain.  Take warm sitz baths to soothe any pain or discomfort caused by hemorrhoids. Use hemorrhoid cream if your health care provider approves.  Wear a good support bra to prevent discomfort from breast tenderness.  If you develop varicose veins: ? Wear support pantyhose or compression stockings as told by your healthcare provider. ? Elevate your feet for 15 minutes, 3-4 times a day. Prenatal care  Write down your questions. Take them to your prenatal visits.  Keep all your prenatal visits as told by your health care provider. This is important. Safety  Wear your seat belt at  all times when driving.  Make a list of emergency phone numbers, including numbers for family, friends, the hospital, and police and fire departments. General instructions  Avoid cat litter boxes and soil used by cats. These carry germs that can cause birth defects in the baby. If you have a cat, ask someone to clean the litter box for you.  Do not travel far distances unless it is absolutely necessary and only with the approval of your health care provider.  Do not use hot tubs, steam rooms, or saunas.  Do not drink alcohol.  Do not use any products that contain nicotine or tobacco, such as cigarettes and e-cigarettes. If you need help quitting, ask your health care provider.  Do not use any medicinal herbs or unprescribed drugs. These chemicals affect the formation and growth of the baby.  Do not douche or use tampons or scented sanitary pads.  Do not cross your legs for long periods of time.  To prepare for the arrival of your baby: ? Take prenatal classes to understand, practice, and ask questions about labor and delivery. ? Make a trial run to the hospital. ? Visit the hospital and tour the maternity area. ? Arrange for maternity or paternity leave through employers. ? Arrange for family and friends to take care of pets while you are in the hospital. ? Purchase a rear-facing car seat and make sure you know how to install it in your car. ? Pack your hospital bag. ? Prepare the baby's nursery. Make sure to remove all pillows and stuffed animals from the baby's crib to prevent suffocation.  Visit your dentist if you have not gone during your pregnancy. Use a soft toothbrush to brush your teeth and be gentle when you floss. Contact a health care provider if:  You are unsure if you are in labor or if your water has broken.  You become dizzy.  You have mild pelvic cramps, pelvic pressure, or nagging pain in your abdominal area.  You have lower back pain.  You have persistent  nausea, vomiting, or diarrhea.  You have an unusual or bad smelling vaginal discharge.  You have pain when you urinate. Get help right away if:  Your water breaks before 37 weeks.  You have regular contractions less than 5 minutes apart before 37 weeks.  You have a fever.  You are leaking fluid from your vagina.  You have spotting or bleeding from your vagina.  You have severe abdominal pain or cramping.  You have rapid weight loss or weight gain.    You have shortness of breath with chest pain.  You notice sudden or extreme swelling of your face, hands, ankles, feet, or legs.  Your baby makes fewer than 10 movements in 2 hours.  You have severe headaches that do not go away when you take medicine.  You have vision changes. Summary  The third trimester is from week 28 through week 40, months 7 through 9. The third trimester is a time when the unborn baby (fetus) is growing rapidly.  During the third trimester, your discomfort may increase as you and your baby continue to gain weight. You may have abdominal, leg, and back pain, sleeping problems, and an increased need to urinate.  During the third trimester your breasts will keep growing and they will continue to become tender. A yellow fluid (colostrum) may leak from your breasts. This is the first milk you are producing for your baby.  False labor is a condition in which you feel small, irregular tightenings of the muscles in the womb (contractions) that eventually go away. These are called Braxton Hicks contractions. Contractions may last for hours, days, or even weeks before true labor sets in.  Signs of labor can include: abdominal cramps; regular contractions that start at 10 minutes apart and become stronger and more frequent with time; watery or bloody mucus discharge that comes from the vagina; increased pelvic pressure and dull back pain; and leaking of amniotic fluid. This information is not intended to replace advice  given to you by your health care provider. Make sure you discuss any questions you have with your health care provider. Document Released: 03/26/2001 Document Revised: 09/07/2015 Document Reviewed: 06/02/2012 Elsevier Interactive Patient Education  2017 [employer]  

## 2017-06-06 ENCOUNTER — Encounter: Payer: Medicaid Other | Admitting: Maternal Newborn

## 2017-06-12 ENCOUNTER — Ambulatory Visit: Payer: Medicaid Other

## 2017-06-12 ENCOUNTER — Ambulatory Visit (INDEPENDENT_AMBULATORY_CARE_PROVIDER_SITE_OTHER): Payer: Medicaid Other

## 2017-06-12 ENCOUNTER — Ambulatory Visit (INDEPENDENT_AMBULATORY_CARE_PROVIDER_SITE_OTHER): Payer: Medicaid Other | Admitting: Obstetrics and Gynecology

## 2017-06-12 DIAGNOSIS — IMO0002 Reserved for concepts with insufficient information to code with codable children: Secondary | ICD-10-CM

## 2017-06-12 DIAGNOSIS — Z0489 Encounter for examination and observation for other specified reasons: Secondary | ICD-10-CM

## 2017-06-12 DIAGNOSIS — Z113 Encounter for screening for infections with a predominantly sexual mode of transmission: Secondary | ICD-10-CM

## 2017-06-12 DIAGNOSIS — Z130 Encounter for screening for diseases of the blood and blood-forming organs and certain disorders involving the immune mechanism: Secondary | ICD-10-CM

## 2017-06-12 DIAGNOSIS — Z3480 Encounter for supervision of other normal pregnancy, unspecified trimester: Secondary | ICD-10-CM

## 2017-06-12 DIAGNOSIS — Z131 Encounter for screening for diabetes mellitus: Secondary | ICD-10-CM

## 2017-06-12 NOTE — Progress Notes (Signed)
Rob- ultrasound GTT

## 2017-06-12 NOTE — Progress Notes (Signed)
Routine Prenatal Care Visit  Subjective  Miranda King is a 22 y.o. G3P1011 at [redacted]w[redacted]d being seen today for ongoing prenatal care.  She is currently monitored for the following issues for this high-risk pregnancy and has Asthma; Tobacco use disorder; Seizure disorder (HCC); Missed abortion; Supervision of other normal pregnancy, antepartum; Limited prenatal care in second trimester; and History of tattoo on their problem list.  ----------------------------------------------------------------------------------- Patient reports no complaints.    .  .   . Denies leaking of fluid.  ----------------------------------------------------------------------------------- The following portions of the patient's history were reviewed and updated as appropriate: allergies, current medications, past family history, past medical history, past social history, past surgical history and problem list. Problem list updated.   Objective  Blood pressure (!) 100/58, weight 145 lb (65.8 kg), last menstrual period 11/23/2016. Pregravid weight 115 lb (52.2 kg) Total Weight Gain 30 lb (13.6 kg) Urinalysis:      Fetal Status:           General:  Alert, oriented and cooperative. Patient is in no acute distress.  Skin: Skin is warm and dry. No rash noted.   Cardiovascular: Normal heart rate noted  Respiratory: Normal respiratory effort, no problems with respiration noted  Abdomen: Soft, gravid, appropriate for gestational age.       Pelvic:  Cervical exam deferred        Extremities: Normal range of motion.     ental Status: Normal mood and affect. Normal behavior. Normal judgment and thought content.   US Ob Comp + 14 Wk  Result Date: 06/03/2017 ULTRASOUND REPORT Location: [facility] Date of Service: 05/29/2017 Indications:Anatomy U/S Findings: Mason Jim intrauterine pregnancy is visualized with FHR at 176 BPM. Biometrics give an (U/S) Gestational age of [redacted]w[redacted]d and an (U/S) EDD of 08/31/2017; this  correlates with the clinically established Estimated Date of Delivery: 08/30/17 Fetal presentation is Cephalic. EFW: 2lbs 0oz Placenta: Posterior, Grade 1. AFI: subjectively normal. Anatomic survey is incomplete for RVOT, nose/lips, abd cord insertion and suboptimal profile d/t fetal position and normal; Gender - surprise.  Right Ovary is normal in appearance. Left Ovary is normal appearance. Survey of the adnexa demonstrates no adnexal masses. There is no free peritoneal fluid in the cul de sac. Impression: 1. [redacted]w[redacted]d Viable Singleton Intrauterine pregnancy by U/S. 2. (U/S) EDD is consistent with Clinically established Estimated Date of Delivery: 08/30/17 . 3. Normal Anatomy Scan Recommendations: 1.Clinical correlation with the patient's History and Physical Exam. 2. Follow up in 3-4 weeks for completion of anatomic survey. Willette Alma, RDMS, RVT  There is a singleton gestation with subjectively normal amniotic fluid volume. The fetal biometry correlates with established dating. Detailed evaluation of the fetal anatomy was performed.The fetal anatomical survey appears within normal limits within the resolution of ultrasound as described above. Right ventricular outflow tract, nose and lips, as well as fetal cord insertion were not visualzied secondary to fetal position.   It must be noted that a normal ultrasound is unable to rule out fetal aneuploidy.  Vena Austria, MD, [facility], [facility] 06/03/2017, 12:37 PM   US Ob Follow Up  Result Date: 06/12/2017 ULTRASOUND REPORT Location: [facility] Date of Service: 06/12/2017 Indications:F/U Anatomy Findings: Mason Jim intrauterine pregnancy is visualized with FHR at 151 BPM. Fetal presentation is Cephalic. Placenta: Fundal, grade 1. AFI: subjectively normal. Anatomic survey is complete. Impression: 1. [redacted]w[redacted]d Viable Singleton Intrauterine pregnancy previously established criteria. 2. Normal Anatomy Scan is now complete  Recommendations: 1.Clinical correlation with  the patient's History and Physical Exam. Willette AlmaKristen Priestley, RDMS, RVT  There is a singleton gestation with subjectively normal amniotic fluid volume. Limited fetal anatomy was performed.The fetal anatomical survey appears within normal limits within the resolution of ultrasound as described above, with previously missing views obtained at today's study.  It must be noted that a normal ultrasound is unable to rule out fetal aneuploidy.  Vena AustriaAndreas Rishikesh Khachatryan, MD, Evern CoreFACOG [facility], [facility] 06/12/2017, 11:12 AM     Assessment   21 y.o. G3P1011 at 6272w5d by  08/30/2017, by Last Menstrual Period presenting for routine prenatal visit  Plan   Pregnancy #3 Problems (from 05/26/17 to present)    Problem Noted Resolved   Supervision of other normal pregnancy, antepartum 05/27/2017 by Natale MilchSchuman, Christanna R, MD No   Overview Addendum 05/27/2017  1:10 PM by Natale MilchSchuman, Christanna R, MD      Clinic [facility] Prenatal Labs  Dating  LMP= 7wk US Blood type:   O Positive  Genetic Screen AFP: 0.74     Quad: Negative, see medical records     Antibody:  negative`  Anatomic US  Rubella: Immune (11/07 0000) Varicella: Unknown  GTT Early:        28 wk:      RPR: Nonreactive (11/07 0000)   Rhogam  HBsAg: Negative (11/07 0000)   TDaP vaccine                       HIV: Non-reactive (11/07 0000)   Flu Shot October 2018 per patient                           GBS:   Contraception  IUD UJW:JXBJYPap:ASCUS HPV+  CBB     CS/VBAC  not applicable   Baby Food  Breast/Bottle   Support Person             Limited prenatal care in second trimester 05/27/2017 by Natale MilchSchuman, Christanna R, MD No       Gestational age appropriate obstetric precautions including but not limited to vaginal bleeding, contractions, leaking of fluid and fetal movement were reviewed in detail with the patient.   - anatomy scan complete - 28 week labs today  Return in about 2 weeks (around 06/26/2017)  for ROB.  Vena AustriaAndreas Rasheda Ledger, MD, Evern CoreFACOG [facility], [facility] 06/12/2017, 11:17 AM

## 2017-06-13 LAB — 28 WEEK RH+PANEL
Basophils Absolute: 0 10*3/uL (ref 0.0–0.2)
Basos: 0 %
EOS (ABSOLUTE): 0.1 10*3/uL (ref 0.0–0.4)
Eos: 1 %
GESTATIONAL DIABETES SCREEN: 83 mg/dL (ref 65–139)
HEMATOCRIT: 31.2 % — AB (ref 34.0–46.6)
HEMOGLOBIN: 10.5 g/dL — AB (ref 11.1–15.9)
HIV Screen 4th Generation wRfx: NONREACTIVE
Immature Grans (Abs): 0 10*3/uL (ref 0.0–0.1)
Immature Granulocytes: 0 %
LYMPHS ABS: 1.8 10*3/uL (ref 0.7–3.1)
Lymphs: 24 %
MCH: 28.9 pg (ref 26.6–33.0)
MCHC: 33.7 g/dL (ref 31.5–35.7)
MCV: 86 fL (ref 79–97)
Monocytes Absolute: 0.4 10*3/uL (ref 0.1–0.9)
Monocytes: 6 %
NEUTROS ABS: 5.2 10*3/uL (ref 1.4–7.0)
Neutrophils: 69 %
PLATELETS: 254 10*3/uL (ref 150–379)
RBC: 3.63 x10E6/uL — ABNORMAL LOW (ref 3.77–5.28)
RDW: 14.4 % (ref 12.3–15.4)
RPR: NONREACTIVE
WBC: 7.5 10*3/uL (ref 3.4–10.8)

## 2017-06-25 ENCOUNTER — Telehealth: Payer: Self-pay

## 2017-06-25 NOTE — Telephone Encounter (Signed)
Spoke w/pt. Notified that PNV rx was sent to pharmacy on 05/27/17 w/12 refills. Pt states "I got these, but naw this ain't gonna work". Inquired w/patient what the issue was. Pt refused to reply stating she would just wait for her apt and discuss. Pt thanked me for returning her call.

## 2017-06-25 NOTE — Telephone Encounter (Signed)
Pt states she needs a rx for her PNV filled. She needs a rx that is covered by Medicaid. VH#[phone]#[id number]

## 2017-06-27 ENCOUNTER — Ambulatory Visit (INDEPENDENT_AMBULATORY_CARE_PROVIDER_SITE_OTHER): Payer: Medicaid Other | Admitting: Maternal Newborn

## 2017-06-27 ENCOUNTER — Encounter: Payer: Medicaid Other | Admitting: Obstetrics and Gynecology

## 2017-06-27 VITALS — BP 110/60 | Wt 147.0 lb

## 2017-06-27 DIAGNOSIS — Z3A30 30 weeks gestation of pregnancy: Secondary | ICD-10-CM

## 2017-06-27 DIAGNOSIS — Z3480 Encounter for supervision of other normal pregnancy, unspecified trimester: Secondary | ICD-10-CM

## 2017-06-27 NOTE — Progress Notes (Signed)
No concerns.rj 

## 2017-06-27 NOTE — Progress Notes (Addendum)
    Routine Prenatal Care Visit  Subjective  Miranda King is a 22 y.o. G3P1011 at 1322w6d being seen today for ongoing prenatal care.  She is currently monitored for the following issues for this low-risk pregnancy and has Asthma; Tobacco use disorder; Seizure disorder (HCC); Supervision of other normal pregnancy, antepartum; Limited prenatal care in second trimester; and History of tattoo on their problem list.  ----------------------------------------------------------------------------------- Patient reports no complaints.   Contractions: Not present. Vag. Bleeding: None.  Movement: Present. Denies leaking of fluid.  ----------------------------------------------------------------------------------- The following portions of the patient's history were reviewed and updated as appropriate: allergies, current medications, past family history, past medical history, past social history, past surgical history and problem list. Problem list updated.   Objective  Last menstrual period 11/23/2016. Pregravid weight 115 lb (52.2 kg) Total Weight Gain 32 lb (14.5 kg) Urinalysis:      Fetal Status: Fetal Heart Rate (bpm): 151 Fundal Height: 29 cm Movement: Present     General:  Alert, oriented and cooperative. Patient is in no acute distress.  Skin: Skin is warm and dry. No rash noted.   Cardiovascular: Normal heart rate noted  Respiratory: Normal respiratory effort, no problems with respiration noted  Abdomen: Soft, gravid, appropriate for gestational age. Pain/Pressure: Absent     Pelvic:  Cervical exam deferred        Extremities: Normal range of motion.     Mental Status: Normal mood and affect. Normal behavior. Normal judgment and thought content.     Assessment   21 y.o. G3P1011 at 5322w6d, EDD 08/30/2017 by Last Menstrual Period presenting for routine prenatal visit.  Plan   Pregnancy #3 Problems (from 05/26/17 to present)    Problem Noted Resolved   Supervision of other normal  pregnancy, antepartum 05/27/2017 by Natale MilchSchuman, Christanna R, MD No   Overview Addendum 06/12/2017 11:25 AM by Vena AustriaStaebler, Andreas, MD      Clinic [facility] Prenatal Labs  Dating  LMP= 7wk US Blood type:   O Positive  Genetic Screen AFP: 0.74     Quad: Negative, see medical records     Antibody:  negative`  Anatomic US Normal female (completed on 28 week follow up) Rubella: Immune (11/07 0000) Varicella: Unknown  GTT     RPR: Nonreactive (11/07 0000)   Rhogam N/A HBsAg: Negative (11/07 0000)   TDaP vaccine                       HIV: Non-reactive (11/07 0000)   Flu Shot October 2018 per patient                           GBS:   Contraception  IUD ZOX:WRUEAPap:ASCUS HPV+  CBB     CS/VBAC  not applicable   Baby Food  Breast/Bottle   Support Person             Limited prenatal care in second trimester 05/27/2017 by Natale MilchSchuman, Christanna R, MD No      Answered patient's questions about circumcision, labs from last visit, and postpartum care.  Offer TDaP next visit.  Preterm labor symptoms and general obstetric precautions were reviewed with the patient.  Return in about 2 weeks (around 07/11/2017) for ROB.  Marcelyn BruinsJacelyn Jonia Oakey, CNM 06/27/2017

## 2017-06-29 ENCOUNTER — Encounter: Payer: Self-pay | Admitting: Maternal Newborn

## 2017-07-11 ENCOUNTER — Ambulatory Visit (INDEPENDENT_AMBULATORY_CARE_PROVIDER_SITE_OTHER): Payer: Medicaid Other | Admitting: Obstetrics and Gynecology

## 2017-07-11 VITALS — BP 104/64 | Wt 147.0 lb

## 2017-07-11 DIAGNOSIS — Z3A32 32 weeks gestation of pregnancy: Secondary | ICD-10-CM

## 2017-07-11 DIAGNOSIS — O0932 Supervision of pregnancy with insufficient antenatal care, second trimester: Secondary | ICD-10-CM

## 2017-07-11 DIAGNOSIS — Z3480 Encounter for supervision of other normal pregnancy, unspecified trimester: Secondary | ICD-10-CM

## 2017-07-11 NOTE — Progress Notes (Signed)
ROB Cramping in bottom of stomach

## 2017-07-11 NOTE — Progress Notes (Signed)
    Routine Prenatal Care Visit  Subjective  Miranda King is a 22 y.o. G3P1011 at 628w6d being seen today for ongoing prenatal care.  She is currently monitored for the following issues for this low-risk pregnancy and has Asthma; Tobacco use disorder; Seizure disorder (HCC); Supervision of other normal pregnancy, antepartum; Limited prenatal care in second trimester; and History of tattoo on their problem list.  ----------------------------------------------------------------------------------- Patient reports no complaints.   Contractions: Irregular. Vag. Bleeding: None.  Movement: Present. Denies leaking of fluid.  ----------------------------------------------------------------------------------- The following portions of the patient's history were reviewed and updated as appropriate: allergies, current medications, past family history, past medical history, past social history, past surgical history and problem list. Problem list updated.   Objective  Blood pressure 104/64, weight 147 lb (66.7 kg), last menstrual period 11/23/2016. Pregravid weight 115 lb (52.2 kg) Total Weight Gain 32 lb (14.5 kg) Urinalysis:      Fetal Status: Fetal Heart Rate (bpm): 140 Fundal Height: 31 cm Movement: Present     General:  Alert, oriented and cooperative. Patient is in no acute distress.  Skin: Skin is warm and dry. No rash noted.   Cardiovascular: Normal heart rate noted  Respiratory: Normal respiratory effort, no problems with respiration noted  Abdomen: Soft, gravid, appropriate for gestational age. Pain/Pressure: Absent     Pelvic:  Cervical exam deferred        Extremities: Normal range of motion.     ental Status: Normal mood and affect. Normal behavior. Normal judgment and thought content.     Assessment   22 y.o. G3P1011 at 6728w6d by  08/30/2017, by Last Menstrual Period presenting for routine prenatal visit  Plan   Pregnancy #3 Problems (from 05/26/17 to present)    Problem  Noted Resolved   Supervision of other normal pregnancy, antepartum 05/27/2017 by Natale MilchSchuman, Christanna R, MD No   Overview Addendum 06/12/2017 11:25 AM by Vena AustriaStaebler, Xyler Terpening, MD      [facility]inic [facility] Prenatal Labs  Dating  LMP= 7wk US Blood type:   O Positive  Genetic Screen AFP: 0.74     Quad: Negative, see medical records     Antibody:  negative`  Anatomic US Normal female (completed on 28 week follow up) Rubella: Immune (11/07 0000) Varicella: Unknown  GTT     RPR: Nonreactive (11/07 0000)   Rhogam N/A HBsAg: Negative (11/07 0000)   TDaP vaccine                       HIV: Non-reactive (11/07 0000)   Flu Shot October 2018 per patient                           GBS:   Contraception  IUD ZOX:WRUEAPap:ASCUS HPV+  CBB     CS/VBAC  not applicable   Baby Food  Breast/Bottle   Support Person             Limited prenatal care in second trimester 05/27/2017 by Natale MilchSchuman, Christanna R, MD No       Gestational age appropriate obstetric precautions including but not limited to vaginal bleeding, contractions, leaking of fluid and fetal movement were reviewed in detail with the patient.    Return in about 2 weeks (around 07/25/2017) for ROB.  Vena AustriaAndreas Eulala Newcombe, MD, Evern CoreFACOG [facility], [facility] 07/11/2017, 12:09 PM

## 2017-07-15 ENCOUNTER — Telehealth: Payer: Self-pay

## 2017-07-15 NOTE — Telephone Encounter (Signed)
Pt's mom called the after hour nurse 5:50pm, pt 35wks preg, c/o pain on right side of chest arm and throat feels pressure as well.  Mom states 911 had been contacted and is on the way.  After hour nurse rec 911 for immediate medical attention. [phone]  UTR at number this am for f/u. Request call back for update.

## 2017-07-17 NOTE — Telephone Encounter (Signed)
Call not returned.  Msg closed.

## 2017-07-24 ENCOUNTER — Observation Stay
Admission: EM | Admit: 2017-07-24 | Discharge: 2017-07-24 | Disposition: A | Discharge status: Home / Self Care | Payer: Medicaid Other | Attending: Obstetrics and Gynecology | Admitting: Obstetrics and Gynecology

## 2017-07-24 ENCOUNTER — Other Ambulatory Visit: Payer: Self-pay

## 2017-07-24 DIAGNOSIS — J45909 Unspecified asthma, uncomplicated: Secondary | ICD-10-CM | POA: Diagnosis not present

## 2017-07-24 DIAGNOSIS — O99513 Diseases of the respiratory system complicating pregnancy, third trimester: Secondary | ICD-10-CM | POA: Insufficient documentation

## 2017-07-24 DIAGNOSIS — O99333 Smoking (tobacco) complicating pregnancy, third trimester: Secondary | ICD-10-CM | POA: Diagnosis not present

## 2017-07-24 DIAGNOSIS — R569 Unspecified convulsions: Secondary | ICD-10-CM | POA: Insufficient documentation

## 2017-07-24 DIAGNOSIS — Z3A34 34 weeks gestation of pregnancy: Secondary | ICD-10-CM | POA: Diagnosis not present

## 2017-07-24 DIAGNOSIS — F1190 Opioid use, unspecified, uncomplicated: Secondary | ICD-10-CM

## 2017-07-24 DIAGNOSIS — O9989 Other specified diseases and conditions complicating pregnancy, childbirth and the puerperium: Secondary | ICD-10-CM | POA: Insufficient documentation

## 2017-07-24 DIAGNOSIS — O99323 Drug use complicating pregnancy, third trimester: Secondary | ICD-10-CM | POA: Insufficient documentation

## 2017-07-24 DIAGNOSIS — Z3480 Encounter for supervision of other normal pregnancy, unspecified trimester: Secondary | ICD-10-CM

## 2017-07-24 DIAGNOSIS — O23593 Infection of other part of genital tract in pregnancy, third trimester: Principal | ICD-10-CM | POA: Insufficient documentation

## 2017-07-24 DIAGNOSIS — O0932 Supervision of pregnancy with insufficient antenatal care, second trimester: Secondary | ICD-10-CM

## 2017-07-24 DIAGNOSIS — O0993 Supervision of high risk pregnancy, unspecified, third trimester: Secondary | ICD-10-CM

## 2017-07-24 DIAGNOSIS — Z885 Allergy status to narcotic agent status: Secondary | ICD-10-CM | POA: Diagnosis not present

## 2017-07-24 DIAGNOSIS — Z79899 Other long term (current) drug therapy: Secondary | ICD-10-CM | POA: Diagnosis not present

## 2017-07-24 DIAGNOSIS — F17210 Nicotine dependence, cigarettes, uncomplicated: Secondary | ICD-10-CM | POA: Diagnosis not present

## 2017-07-24 LAB — URINE DRUG SCREEN, QUALITATIVE (ARMC ONLY)
Amphetamines, Ur Screen: NOT DETECTED
Barbiturates, Ur Screen: NOT DETECTED
Benzodiazepine, Ur Scrn: NOT DETECTED
COCAINE METABOLITE, UR ~~LOC~~: NOT DETECTED
Cannabinoid 50 Ng, Ur ~~LOC~~: NOT DETECTED
MDMA (ECSTASY) UR SCREEN: NOT DETECTED
METHADONE SCREEN, URINE: POSITIVE — AB
OPIATE, UR SCREEN: NOT DETECTED
Phencyclidine (PCP) Ur S: NOT DETECTED
TRICYCLIC, UR SCREEN: NOT DETECTED

## 2017-07-24 LAB — WET PREP, GENITAL
Sperm: NONE SEEN
Trich, Wet Prep: NONE SEEN
Yeast Wet Prep HPF POC: NONE SEEN

## 2017-07-24 LAB — URINALYSIS, COMPLETE (UACMP) WITH MICROSCOPIC
BACTERIA UA: NONE SEEN
Bilirubin Urine: NEGATIVE
Glucose, UA: NEGATIVE mg/dL
Hgb urine dipstick: NEGATIVE
Ketones, ur: NEGATIVE mg/dL
LEUKOCYTES UA: NEGATIVE
NITRITE: NEGATIVE
PH: 7 (ref 5.0–8.0)
Protein, ur: NEGATIVE mg/dL
SPECIFIC GRAVITY, URINE: 1.02 (ref 1.005–1.030)

## 2017-07-24 LAB — CHLAMYDIA/NGC RT PCR (ARMC ONLY)
CHLAMYDIA TR: NOT DETECTED
N GONORRHOEAE: NOT DETECTED

## 2017-07-24 MED ORDER — METRONIDAZOLE 500 MG PO TABS
500.0000 mg | ORAL_TABLET | ORAL | Status: AC
  Administered 2017-07-24: 500 mg via ORAL
  Filled 2017-07-24: qty 1

## 2017-07-24 MED ORDER — METRONIDAZOLE 500 MG PO TABS: 500.0000 mg | ORAL_TABLET | Freq: Two times a day (BID) | ORAL | 0 refills | Status: AC

## 2017-07-24 NOTE — Progress Notes (Signed)
Patient discharged to home with family members. Patient provided discharge instructions and states that she understands and has no further questions. [facility] FHT at discharge 150.

## 2017-07-24 NOTE — Final Progress Note (Signed)
Physician Final Progress Note  Patient ID: Miranda King MRN: [id number] DOB/AGE: December 23, 1995 22 y.o.  Admit date: 07/24/2017 Admitting provider: Conard Novak, MD Discharge date: 07/24/2017   Admission Diagnoses:  1) intrauterine pregnancy at [redacted]w[redacted]d 2) lower abdominal and pelvic floor pain  Discharge Diagnoses:  1) intrauterine pregnancy at [redacted]w[redacted]d 2) lower abdominal and pelvic floor pain 3) Bacterial vaginosis 4) methadone use  History of Present Illness: The patient is a 22 y.o. female G3P1011 at [redacted]w[redacted]d who presents for lower abdominal cramping and vaginal pain. Mainly she describes pelvic floor discomfort which started yesterday. The discomfort has become worse recently.  She notes +FM, no LOF, no VB, ? contractions.   She denies urinary symptoms of dysuria and abnormal urine smell. She denies vaginal symptoms of itching, burning, and irritation.  She does not an increase in discharge recently.  Hospital Course: the patient was admitted to labor and delivery for observation.  The fetal tracing was normal and category 1. Her vitals were normal and stable.  She had +clue cells and many WBCs on wet prep.  GC/Chlamydia were negative.  UDS was positive for methadone.  Her cervix was dilated to 1cm/thick/high. She initially appeared to have som mild contractions, however these subsided. At the time of her discharge, she states that her symptoms are greatly improved.  She has followed up in the morning at 10 am at [facility].  She was, therefore, discharged with precautions to return to the hospital if contractions return.  We discussed her dilation. However, given her lack of current symptoms and the fact that her cervix was posterior and thick and only slightly dilated, we discussed close follow up in the morning with recheck at that time, unless symptoms get worse overnight.  She was given one dose of flagyl prior to discharge and a prescription was sent to her pharmacy. Discussed finding of  methadone on her UDS. Discussed neurocognative risk to fetus and possible need for long-term stay after birth if addicted to opioids (NAS).  She voiced understanding.  She agreed to follow up in the morning at her regularly scheduled appointment.  Past Medical History:  Diagnosis Date  . Asthma   . Seizures (HCC)   . Tobacco use disorder     Past Surgical History:  Procedure Laterality Date  . DILATION AND EVACUATION N/A 10/03/2015   Procedure: DILATATION AND EVACUATION;  Surgeon: Nadara Mustard, MD;  Location: [facility] ORS;  Service: Gynecology;  Laterality: N/A;  . VAGINAL DELIVERY      No current facility-administered medications on file prior to encounter.    Current Outpatient Medications on File Prior to Encounter  Medication Sig Dispense Refill  . Prenatal Multivit-Min-Fe-FA (PRENATAL VITAMINS) 0.8 MG tablet Take 1 tablet by mouth daily. 30 tablet 12  . albuterol (PROVENTIL HFA;VENTOLIN HFA) 108 (90 Base) MCG/ACT inhaler Inhale 2 puffs into the lungs every 6 (six) hours as needed for wheezing or shortness of breath. (Patient not taking: Reported on 07/24/2017) 1 Inhaler 2  . ferrous sulfate 325 (65 FE) MG tablet Take 1 tablet (325 mg total) by mouth daily with breakfast. 30 tablet 0    Allergies  Allergen Reactions  . Tramadol Hcl Nausea And Vomiting    Social History   Socioeconomic History  . Marital status: Single    Spouse name: Not on file  . Number of children: Not on file  . Years of education: Not on file  . Highest education level: Not on file  Occupational History  .  Not on file  Social Needs  . Financial resource strain: Not on file  . Food insecurity:    Worry: Not on file    Inability: Not on file  . Transportation needs:    Medical: Not on file    Non-medical: Not on file  Tobacco Use  . Smoking status: Current Some Day Smoker  . Smokeless tobacco: Never Used  . Tobacco comment: Had a cigarette prior to arriving at the hospital.   Substance and  Sexual Activity  . Alcohol use: No    Alcohol/week: 0.0 oz  . Drug use: No  . Sexual activity: Yes    Birth control/protection: IUD  Lifestyle  . Physical activity:    Days per week: Not on file    Minutes per session: Not on file  . Stress: Not on file  Relationships  . Social connections:    Talks on phone: Not on file    Gets together: Not on file    Attends religious service: Not on file    Active member of club or organization: Not on file    Attends meetings of clubs or organizations: Not on file    Relationship status: Not on file  . Intimate partner violence:    Fear of current or ex partner: Not on file    Emotionally abused: Not on file    Physically abused: Not on file    Forced sexual activity: Not on file  Other Topics Concern  . Not on file  Social History Narrative  . Not on file    Physical Exam: Temp 98.4 F (36.9 C) (Oral)   Ht 5\' 5"  (1.651 m)   Wt 145 lb (65.8 kg)   LMP 11/23/2016 Comment: inconsistent spotting with IUD  BMI 24.13 kg/m   Physical Exam  Constitutional: She is oriented to person, place, and time. She appears well-developed and well-nourished. No distress.  Genitourinary: Pelvic exam was performed with patient supine. There is no rash, tenderness or lesion on the right labia. There is no rash, tenderness or lesion on the left labia. Vagina exhibits no lesion. No erythema, tenderness or bleeding in the vagina. No signs of injury around the vagina. Vaginal discharge found.  Genitourinary Comments: SVE: 1/thick/high/posterior/firm  HENT:  Head: Normocephalic and atraumatic.  Eyes: Conjunctivae are normal. No scleral icterus.  Cardiovascular: Normal rate and regular rhythm. Exam reveals no gallop and no friction rub.  No murmur heard. Pulmonary/Chest: Effort normal and breath sounds normal. No respiratory distress.  Abdominal: Soft. Bowel sounds are normal. She exhibits mass (gravid). She exhibits no distension. There is no tenderness.  There is no rebound and no guarding. No hernia.  Musculoskeletal: Normal range of motion. She exhibits no edema.  Neurological: She is alert and oriented to person, place, and time. No cranial nerve deficit.  Skin: Skin is warm and dry. No erythema.  Psychiatric: She has a normal mood and affect. Judgment and thought content normal.   Female chaperone present for pelvic exam:  Consults: None  Significant Findings/ Diagnostic Studies:  Lab Results  Component Value Date   APPEARANCEUR CLOUDY (A) 07/24/2017   GLUCOSEU NEGATIVE 07/24/2017   BILIRUBINUR NEGATIVE 07/24/2017   KETONESUR NEGATIVE 07/24/2017   LABSPEC 1.020 07/24/2017   HGBUR NEGATIVE 07/24/2017   PHURINE 7.0 07/24/2017   NITRITE NEGATIVE 07/24/2017   LEUKOCYTESUR NEGATIVE 07/24/2017   RBCU 0-5 07/24/2017   WBCU 6-30 07/24/2017   BACTERIA NONE SEEN 07/24/2017   EPIU 0-5 (A) 07/24/2017  MUCOUSUACOMP PRESENT 06/25/2013    Lab Results  Component Value Date   CHLAMYDIA NOT DETECTED 07/24/2017   NGONORRHOEAE NOT DETECTED 07/24/2017     Lab Results  Component Value Date   TRICHWETPREP NONE SEEN 07/24/2017   CLUECELLS PRESENT (A) 07/24/2017   WBCWETPREP MANY (A) 07/24/2017   YEASTWETPREP NONE SEEN 07/24/2017   Lab Results  Component Value Date   TCA NONE DETECTED 07/24/2017   MDMA NONE DETECTED 07/24/2017   COCAINSCRNUR NONE DETECTED 07/24/2017   LABOPIA NONE DETECTED 07/24/2017   PCPSCRNUR NONE DETECTED 07/24/2017   THCU NONE DETECTED 07/24/2017   LABBARB NONE DETECTED 07/24/2017   LABBENZ NONE DETECTED 07/24/2017   METHADONE POSITIVE (A) 07/24/2017    Procedures:  NST Baseline FHR: 135 beats/min Variability: moderate Accelerations: present Decelerations: absent Tocometry: irritability  Interpretation:  INDICATIONS: rule out uterine contractions RESULTS:  A NST procedure was performed with FHR monitoring and a normal baseline established, appropriate time of 20-40 minutes of evaluation, and accels >2  seen w 15x15 characteristics.  Results show a REACTIVE NST.    Discharge Condition: stable  Disposition: Discharge disposition: 01-Home or Self Care       Diet: Regular diet  Discharge Activity: Activity as tolerated   Allergies as of 07/24/2017      Reactions   Tramadol Hcl Nausea And Vomiting      Medication List    TAKE these medications   albuterol 108 (90 Base) MCG/ACT inhaler Commonly known as:  PROVENTIL HFA;VENTOLIN HFA Inhale 2 puffs into the lungs every 6 (six) hours as needed for wheezing or shortness of breath.   ferrous sulfate 325 (65 FE) MG tablet Take 1 tablet (325 mg total) by mouth daily with breakfast.   metroNIDAZOLE 500 MG tablet Commonly known as:  FLAGYL Take 1 tablet (500 mg total) by mouth 2 (two) times daily for 7 days.   Prenatal Vitamins 0.8 MG tablet Take 1 tablet by mouth daily.      Follow-up Information    [facility] Follow up in 1 day(s).   Specialty:  Obstetrics and Gynecology Why:  Keep previously scheduled appt Contact information: [address] [place] [phone]          Total time spent taking care of this patient: 45 minutes  Signed: Thomasene MohairStephen Trevino Wyatt, MD  07/24/2017, 11:34 PM

## 2017-07-24 NOTE — Discharge Instructions (Signed)
Braxton Hicks Contractions °Contractions of the uterus can occur throughout pregnancy, but they are not always a sign that you are in labor. You may have practice contractions called Braxton Hicks contractions. These false labor contractions are sometimes confused with true labor. °What are Braxton Hicks contractions? °Braxton Hicks contractions are tightening movements that occur in the muscles of the uterus before labor. Unlike true labor contractions, these contractions do not result in opening (dilation) and thinning of the cervix. Toward the end of pregnancy (32-34 weeks), Braxton Hicks contractions can happen more often and may become stronger. These contractions are sometimes difficult to tell apart from true labor because they can be very uncomfortable. You should not feel embarrassed if you go to the hospital with false labor. °Sometimes, the only way to tell if you are in true labor is for your health care provider to look for changes in the cervix. The health care provider will do a physical exam and may monitor your contractions. If you are not in true labor, the exam should show that your cervix is not dilating and your water has not broken. °If there are other health problems associated with your pregnancy, it is completely safe for you to be sent home with false labor. You may continue to have Braxton Hicks contractions until you go into true labor. °How to tell the difference between true labor and false labor °True labor °· Contractions last 30-70 seconds. °· Contractions become very regular. °· Discomfort is usually felt in the top of the uterus, and it spreads to the lower abdomen and low back. °· Contractions do not go away with walking. °· Contractions usually become more intense and increase in frequency. °· The cervix dilates and gets thinner. °False labor °· Contractions are usually shorter and not as strong as true labor contractions. °· Contractions are usually irregular. °· Contractions  are often felt in the front of the lower abdomen and in the groin. °· Contractions may go away when you walk around or change positions while lying down. °· Contractions get weaker and are shorter-lasting as time goes on. °· The cervix usually does not dilate or become thin. °Follow these instructions at home: °· Take over-the-counter and prescription medicines only as told by your health care provider. °· Keep up with your usual exercises and follow other instructions from your health care provider. °· Eat and drink lightly if you think you are going into labor. °· If Braxton Hicks contractions are making you uncomfortable: °? Change your position from lying down or resting to walking, or change from walking to resting. °? Sit and rest in a tub of warm water. °? Drink enough fluid to keep your urine pale yellow. Dehydration may cause these contractions. °? Do slow and deep breathing several times an hour. °· Keep all follow-up prenatal visits as told by your health care provider. This is important. °Contact a health care provider if: °· You have a fever. °· You have continuous pain in your abdomen. °Get help right away if: °· Your contractions become stronger, more regular, and closer together. °· You have fluid leaking or gushing from your vagina. °· You pass blood-tinged mucus (bloody show). °· You have bleeding from your vagina. °· You have low back pain that you never had before. °· You feel your baby’s head pushing down and causing pelvic pressure. °· Your baby is not moving inside you as much as it used to. °Summary °· Contractions that occur before labor are called Braxton   Hicks contractions, false labor, or practice contractions. °· Braxton Hicks contractions are usually shorter, weaker, farther apart, and less regular than true labor contractions. True labor contractions usually become progressively stronger and regular and they become more frequent. °· Manage discomfort from Braxton Hicks contractions by  changing position, resting in a warm bath, drinking plenty of water, or practicing deep breathing. °This information is not intended to replace advice given to you by your health care provider. Make sure you discuss any questions you have with your health care provider. °Document Released: 08/15/2016 Document Revised: 08/15/2016 Document Reviewed: 08/15/2016 °Elsevier Interactive Patient Education © 2018 [employer] Inc[employer] ° °

## 2017-07-24 NOTE — OB Triage Note (Addendum)
Pt. G3P2 Presented to ED with complaints of cotnractions every two-three minutes. States "all in the pain is in my kitty kat. It's all down there, nothing in my back. I feel like if I feel my put my fingers in there, I'll be able to touch him." Denies having sex within the last week. Denies having any vaginal bleeding or discharge. Has some leaking of fluid. Nitrozine negative. States that contractions are a 6 out of 10 and palpate mild.

## 2017-07-25 ENCOUNTER — Ambulatory Visit (INDEPENDENT_AMBULATORY_CARE_PROVIDER_SITE_OTHER): Payer: Medicaid Other | Admitting: Obstetrics and Gynecology

## 2017-07-25 VITALS — BP 102/58 | Wt 150.0 lb

## 2017-07-25 DIAGNOSIS — O99320 Drug use complicating pregnancy, unspecified trimester: Secondary | ICD-10-CM

## 2017-07-25 DIAGNOSIS — Z3A34 34 weeks gestation of pregnancy: Secondary | ICD-10-CM

## 2017-07-25 DIAGNOSIS — Z3480 Encounter for supervision of other normal pregnancy, unspecified trimester: Secondary | ICD-10-CM

## 2017-07-25 DIAGNOSIS — F1910 Other psychoactive substance abuse, uncomplicated: Secondary | ICD-10-CM | POA: Insufficient documentation

## 2017-07-25 NOTE — Progress Notes (Signed)
Routine Prenatal Care Visit  Subjective  Miranda King is a 22 y.o. G3P1011 at 6426w6d being seen today for ongoing prenatal care.  She is currently monitored for the following issues for this high-risk pregnancy and has Asthma; Tobacco use disorder; Seizure disorder (HCC); Supervision of other normal pregnancy, antepartum; Limited prenatal care in second trimester; History of tattoo; Opioid use; and Substance abuse affecting pregnancy, antepartum on their problem list.  ----------------------------------------------------------------------------------- Patient reports no complaints.  Was seen on L&D for contractions yesterday discharged with stable cervix at 1cm. Contractions: Irregular. Vag. Bleeding: None.  Movement: Present. Denies leaking of fluid.  ----------------------------------------------------------------------------------- The following portions of the patient's history were reviewed and updated as appropriate: allergies, current medications, past family history, past medical history, past social history, past surgical history and problem list. Problem list updated.   Objective  Blood pressure (!) 102/58, weight 150 lb (68 kg), last menstrual period 11/23/2016. Pregravid weight 115 lb (52.2 kg) Total Weight Gain 35 lb (15.9 kg) Urinalysis:      Fetal Status: Fetal Heart Rate (bpm): 145 Fundal Height: 34 cm Movement: Present     General:  Alert, oriented and cooperative. Patient is in no acute distress.  Skin: Skin is warm and dry. No rash noted.   Cardiovascular: Normal heart rate noted  Respiratory: Normal respiratory effort, no problems with respiration noted  Abdomen: Soft, gravid, appropriate for gestational age. Pain/Pressure: Absent     Pelvic:  Cervical exam deferred        Extremities: Normal range of motion.     ental Status: Normal mood and affect. Normal behavior. Normal judgment and thought content.     Assessment   22 y.o. G3P1011 at 7226w6d by   08/30/2017, by Last Menstrual Period presenting for routine prenatal visit  Plan   Pregnancy #3 Problems (from 05/26/17 to present)    Problem Noted Resolved   Substance abuse affecting pregnancy, antepartum 07/25/2017 by Vena AustriaStaebler, Silena Wyss, MD No   Overview Signed 07/25/2017 11:18 AM by Vena AustriaStaebler, Ryleigh Esqueda, MD    Methadone positive UDS on 07/24/2017      Opioid use 07/24/2017 by Conard NovakJackson, Stephen D, MD No   Supervision of other normal pregnancy, antepartum 05/27/2017 by Natale MilchSchuman, Christanna R, MD No   Overview Addendum 06/12/2017 11:25 AM by Vena AustriaStaebler, Rainn Zupko, MD      [facility] Prenatal Labs  Dating  LMP= 7wk US Blood type:   O Positive  Genetic Screen AFP: 0.74     Quad: Negative, see medical records     Antibody:  negative`  Anatomic US Normal female (completed on 28 week follow up) Rubella: Immune (11/07 0000) Varicella: Unknown  GTT     RPR: Nonreactive (11/07 0000)   Rhogam N/A HBsAg: Negative (11/07 0000)   TDaP vaccine                       HIV: Non-reactive (11/07 0000)   Flu Shot October 2018 per patient                           GBS:   Contraception  IUD RJJ:OACZYPap:ASCUS HPV+  CBB     CS/VBAC  not applicable   Baby Food  Breast/Bottle   Support Person             Limited prenatal care in second trimester 05/27/2017 by Natale MilchSchuman, Christanna R, MD No       Gestational age appropriate  obstetric precautions including but not limited to vaginal bleeding, contractions, leaking of fluid and fetal movement were reviewed in detail with the patient.    Return in about 2 weeks (around 08/08/2017) for ROB.  Vena Austria, MD, Evern Core [facility], [facility] 07/25/2017, 10:07 PM

## 2017-07-25 NOTE — Progress Notes (Signed)
ROB Heartburn Pt states unable to get urine

## 2017-07-25 NOTE — Discharge Summary (Signed)
See Final Progress Note 

## 2017-07-28 ENCOUNTER — Telehealth: Payer: Self-pay

## 2017-07-28 NOTE — Telephone Encounter (Signed)
Patient is calling to follow up on on work note. Patient states she has to be at work at [employer] tonight and needs to know for her to return note. Please advise

## 2017-07-28 NOTE — Telephone Encounter (Signed)
Please advise 

## 2017-07-28 NOTE — Telephone Encounter (Signed)
Pt went to hospital Thursday afternoon for ctx. She was having braxton hicks and also had bv. She needs a note from Thursday- Sunday for her job bc they won't let her return w/o a note. She tried to get a note from the ER, but they said she needs a note from her primary/OB Dr. Rock NephewPt is requesting note from AMS that in the note she be given work restrictions. She works at [employer] 12 hour shifts on her feet and it's difficult. Pt requesting a 4-5 hour shift restriction. No phone # given. Recorded # message was left from.

## 2017-07-29 NOTE — Telephone Encounter (Signed)
Please advise. Routing to Kim [place] to call patient

## 2017-07-29 NOTE — Telephone Encounter (Signed)
Pt aware no note will be written by [employer]. Not medically necessary

## 2017-07-29 NOTE — Telephone Encounter (Signed)
I never wrote her out of work

## 2017-08-08 ENCOUNTER — Encounter: Payer: Self-pay | Admitting: Maternal Newborn

## 2017-08-08 ENCOUNTER — Ambulatory Visit (INDEPENDENT_AMBULATORY_CARE_PROVIDER_SITE_OTHER): Payer: Medicaid Other | Admitting: Maternal Newborn

## 2017-08-08 VITALS — BP 100/60 | Wt 155.0 lb

## 2017-08-08 DIAGNOSIS — N898 Other specified noninflammatory disorders of vagina: Secondary | ICD-10-CM

## 2017-08-08 DIAGNOSIS — Z23 Encounter for immunization: Secondary | ICD-10-CM

## 2017-08-08 DIAGNOSIS — O0993 Supervision of high risk pregnancy, unspecified, third trimester: Secondary | ICD-10-CM | POA: Diagnosis not present

## 2017-08-08 MED ORDER — TETANUS-DIPHTH-ACELL PERTUSSIS 5-2.5-18.5 LF-MCG/0.5 IM SUSP
0.5000 mL | Freq: Once | INTRAMUSCULAR | Status: AC
  Administered 2017-08-08: 0.5 mL via INTRAMUSCULAR

## 2017-08-08 NOTE — Progress Notes (Signed)
Routine Prenatal Care Visit  Subjective  Miranda King is a 22 y.o. G3P1011 at 2034w6d being seen today for ongoing prenatal care.  She is currently monitored for the following issues for this high-risk pregnancy and has Asthma; Tobacco use disorder; Seizure disorder (HCC); Supervision of high risk pregnancy, antepartum, third trimester; Limited prenatal care in second trimester; History of tattoo; Opioid use; and Substance abuse affecting pregnancy, antepartum on their problem list.  ----------------------------------------------------------------------------------- Patient reports vaginal irritation. She says that her discharge has improved since being treated for BV, but she is concerned that she may have another type of infection because she is very sore. Contractions: Not present. Vag. Bleeding: None.  Movement: Present. Denies leaking of fluid.  ----------------------------------------------------------------------------------- The following portions of the patient's history were reviewed and updated as appropriate: allergies, current medications, past family history, past medical history, past social history, past surgical history and problem list. Problem list updated.   Objective  Blood pressure 100/60, weight 155 lb (70.3 kg), last menstrual period 11/23/2016. Pregravid weight 115 lb (52.2 kg) Total Weight Gain 40 lb (18.1 kg) Urinalysis: Urine Protein: Negative Urine Glucose: Negative  Fetal Status: Fetal Heart Rate (bpm): 144 Fundal Height: 36 cm Movement: Present     General:  Alert, oriented and cooperative. Patient is in no acute distress.  Skin: Skin is warm and dry. No rash noted.   Cardiovascular: Normal heart rate noted  Respiratory: Normal respiratory effort, no problems with respiration noted  Abdomen: Soft, gravid, appropriate for gestational age. Pain/Pressure: Present     Pelvic:  Cervical exam deferred        Extremities: Normal range of motion.     Mental  Status: Normal mood and affect. Normal behavior. Normal judgment and thought content.   External vulvar and vaginal area appears irritated.  Assessment   21 y.o. G3P1011 at 4134w6d, EDD  08/30/2017 by Last Menstrual Period presenting for routine prenatal visit.  Plan   Pregnancy #3 Problems (from 05/26/17 to present)    Problem Noted Resolved   Substance abuse affecting pregnancy, antepartum 07/25/2017 by Vena AustriaStaebler, Andreas, MD No   Overview Signed 07/25/2017 11:18 AM by Vena AustriaStaebler, Andreas, MD    Methadone positive UDS on 07/24/2017      Opioid use 07/24/2017 by Conard NovakJackson, Stephen D, MD No   Supervision of other normal pregnancy, antepartum 05/27/2017 by Natale MilchSchuman, Christanna R, MD No   Overview Addendum 06/12/2017 11:25 AM by Vena AustriaStaebler, Andreas, MD      [facility]inic [facility] Prenatal Labs  Dating  LMP= 7wk US Blood type:   O Positive  Genetic Screen AFP: 0.74     Quad: Negative, see medical records     Antibody:  negative`  Anatomic US Normal female (completed on 28 week follow up) Rubella: Immune (11/07 0000) Varicella: Unknown  GTT     RPR: Nonreactive (11/07 0000)   Rhogam N/A HBsAg: Negative (11/07 0000)   TDaP vaccine                       HIV: Non-reactive (11/07 0000)   Flu Shot October 2018 per patient                           GBS:   Contraception  IUD ZOX:WRUEAPap:ASCUS HPV+  CBB     CS/VBAC  not applicable   Art gallery managerBaby Food  Breast/Bottle   Support Person  Limited prenatal care in second trimester 05/27/2017 by Natale Milch, MD No    GBS and NuSwab today for ongoing symptoms. TDaP received this visit.  Preterm labor symptoms and general obstetric precautions including but not limited to vaginal bleeding, contractions, leaking of fluid and fetal movement were reviewed.  Return in about 1 week (around 08/15/2017) for ROB.  Marcelyn Bruins, CNM 08/08/2017  1:52 PM

## 2017-08-13 LAB — NUSWAB VAGINITIS PLUS (VG+)
Candida albicans, NAA: NEGATIVE
Candida glabrata, NAA: NEGATIVE
Chlamydia trachomatis, NAA: NEGATIVE
Neisseria gonorrhoeae, NAA: NEGATIVE
TRICH VAG BY NAA: NEGATIVE

## 2017-08-13 LAB — STREP GP B NAA: Strep Gp B NAA: NEGATIVE

## 2017-08-15 ENCOUNTER — Encounter: Payer: Self-pay | Admitting: Advanced Practice Midwife

## 2017-08-15 ENCOUNTER — Ambulatory Visit (INDEPENDENT_AMBULATORY_CARE_PROVIDER_SITE_OTHER): Payer: Medicaid Other | Admitting: Advanced Practice Midwife

## 2017-08-15 VITALS — BP 104/74 | Wt 158.0 lb

## 2017-08-15 DIAGNOSIS — Z3A37 37 weeks gestation of pregnancy: Secondary | ICD-10-CM

## 2017-08-15 NOTE — Patient Instructions (Signed)
Vaginal Delivery Vaginal delivery means that you will give birth by pushing your baby out of your birth canal (vagina). A team of health care providers will help you before, during, and after vaginal delivery. Birth experiences are unique for every woman and every pregnancy, and birth experiences vary depending on where you choose to give birth. What should I do to prepare for my baby's birth? Before your baby is born, it is important to talk with your health care provider about:  Your labor and delivery preferences. These may include: ? Medicines that you may be given. ? How you will manage your pain. This might include non-medical pain relief techniques or injectable pain relief such as epidural analgesia. ? How you and your baby will be monitored during labor and delivery. ? Who may be in the labor and delivery room with you. ? Your feelings about surgical delivery of your baby (cesarean delivery, or C-section) if this becomes necessary. ? Your feelings about receiving donated blood through an IV tube (blood transfusion) if this becomes necessary.  Whether you are able: ? To take pictures or videos of the birth. ? To eat during labor and delivery. ? To move around, walk, or change positions during labor and delivery.  What to expect after your baby is born, such as: ? Whether delayed umbilical cord clamping and cutting is offered. ? Who will care for your baby right after birth. ? Medicines or tests that may be recommended for your baby. ? Whether breastfeeding is supported in your hospital or birth center. ? How long you will be in the hospital or birth center.  How any medical conditions you have may affect your baby or your labor and delivery experience.  To prepare for your baby's birth, you should also:  Attend all of your health care visits before delivery (prenatal visits) as recommended by your health care provider. This is important.  Prepare your home for your baby's  arrival. Make sure that you have: ? Diapers. ? Baby clothing. ? Feeding equipment. ? Safe sleeping arrangements for you and your baby.  Install a car seat in your vehicle. Have your car seat checked by a certified car seat installer to make sure that it is installed safely.  Think about who will help you with your new baby at home for at least the first several weeks after delivery.  What can I expect when I arrive at the birth center or hospital? Once you are in labor and have been admitted into the hospital or birth center, your health care provider may:  Review your pregnancy history and any concerns you have.  Insert an IV tube into one of your veins. This is used to give you fluids and medicines.  Check your blood pressure, pulse, temperature, and heart rate (vital signs).  Check whether your bag of water (amniotic sac) has broken (ruptured).  Talk with you about your birth plan and discuss pain control options.  Monitoring Your health care provider may monitor your contractions (uterine monitoring) and your baby's heart rate (fetal monitoring). You may need to be monitored:  Often, but not continuously (intermittently).  All the time or for long periods at a time (continuously). Continuous monitoring may be needed if: ? You are taking certain medicines, such as medicine to relieve pain or make your contractions stronger. ? You have pregnancy or labor complications.  Monitoring may be done by:  Placing a special stethoscope or a handheld monitoring device on your abdomen to   check your baby's heartbeat, and feeling your abdomen for contractions. This method of monitoring does not continuously record your baby's heartbeat or your contractions.  Placing monitors on your abdomen (external monitors) to record your baby's heartbeat and the frequency and length of contractions. You may not have to wear external monitors all the time.  Placing monitors inside of your uterus  (internal monitors) to record your baby's heartbeat and the frequency, length, and strength of your contractions. ? Your health care provider may use internal monitors if he or she needs more information about the strength of your contractions or your baby's heart rate. ? Internal monitors are put in place by passing a thin, flexible wire through your vagina and into your uterus. Depending on the type of monitor, it may remain in your uterus or on your baby's head until birth. ? Your health care provider will discuss the benefits and risks of internal monitoring with you and will ask for your permission before inserting the monitors.  Telemetry. This is a type of continuous monitoring that can be done with external or internal monitors. Instead of having to stay in bed, you are able to move around during telemetry. Ask your health care provider if telemetry is an option for you.  Physical exam Your health care provider may perform a physical exam. This may include:  Checking whether your baby is positioned: ? With the head toward your vagina (head-down). This is most common. ? With the head toward the top of your uterus (head-up or breech). If your baby is in a breech position, your health care provider may try to turn your baby to a head-down position so you can deliver vaginally. If it does not seem that your baby can be born vaginally, your provider may recommend surgery to deliver your baby. In rare cases, you may be able to deliver vaginally if your baby is head-up (breech delivery). ? Lying sideways (transverse). Babies that are lying sideways cannot be delivered vaginally.  Checking your cervix to determine: ? Whether it is thinning out (effacing). ? Whether it is opening up (dilating). ? How low your baby has moved into your birth canal.  What are the three stages of labor and delivery?  Normal labor and delivery is divided into the following three stages: Stage 1  Stage 1 is the  longest stage of labor, and it can last for hours or days. Stage 1 includes: ? Early labor. This is when contractions may be irregular, or regular and mild. Generally, early labor contractions are more than 10 minutes apart. ? Active labor. This is when contractions get longer, more regular, more frequent, and more intense. ? The transition phase. This is when contractions happen very close together, are very intense, and may last longer than during any other part of labor.  Contractions generally feel mild, infrequent, and irregular at first. They get stronger, more frequent (about every 2-3 minutes), and more regular as you progress from early labor through active labor and transition.  Many women progress through stage 1 naturally, but you may need help to continue making progress. If this happens, your health care provider may talk with you about: ? Rupturing your amniotic sac if it has not ruptured yet. ? Giving you medicine to help make your contractions stronger and more frequent.  Stage 1 ends when your cervix is completely dilated to 4 inches (10 cm) and completely effaced. This happens at the end of the transition phase. Stage 2  Once   your cervix is completely effaced and dilated to 4 inches (10 cm), you may start to feel an urge to push. It is common for the body to naturally take a rest before feeling the urge to push, especially if you received an epidural or certain other pain medicines. This rest period may last for up to 1-2 hours, depending on your unique labor experience.  During stage 2, contractions are generally less painful, because pushing helps relieve contraction pain. Instead of contraction pain, you may feel stretching and burning pain, especially when the widest part of your baby's head passes through the vaginal opening (crowning).  Your health care provider will closely monitor your pushing progress and your baby's progress through the vagina during stage 2.  Your  health care provider may massage the area of skin between your vaginal opening and anus (perineum) or apply warm compresses to your perineum. This helps it stretch as the baby's head starts to crown, which can help prevent perineal tearing. ? In some cases, an incision may be made in your perineum (episiotomy) to allow the baby to pass through the vaginal opening. An episiotomy helps to make the opening of the vagina larger to allow more room for the baby to fit through.  It is very important to breathe and focus so your health care provider can control the delivery of your baby's head. Your health care provider may have you decrease the intensity of your pushing, to help prevent perineal tearing.  After delivery of your baby's head, the shoulders and the rest of the body generally deliver very quickly and without difficulty.  Once your baby is delivered, the umbilical cord may be cut right away, or this may be delayed for 1-2 minutes, depending on your baby's health. This may vary among health care providers, hospitals, and birth centers.  If you and your baby are healthy enough, your baby may be placed on your chest or abdomen to help maintain the baby's temperature and to help you bond with each other. Some mothers and babies start breastfeeding at this time. Your health care team will dry your baby and help keep your baby warm during this time.  Your baby may need immediate care if he or she: ? Showed signs of distress during labor. ? Has a medical condition. ? Was born too early (prematurely). ? Had a bowel movement before birth (meconium). ? Shows signs of difficulty transitioning from being inside the uterus to being outside of the uterus. If you are planning to breastfeed, your health care team will help you begin a feeding. Stage 3  The third stage of labor starts immediately after the birth of your baby and ends after you deliver the placenta. The placenta is an organ that develops  during pregnancy to provide oxygen and nutrients to your baby in the womb.  Delivering the placenta may require some pushing, and you may have mild contractions. Breastfeeding can stimulate contractions to help you deliver the placenta.  After the placenta is delivered, your uterus should tighten (contract) and become firm. This helps to stop bleeding in your uterus. To help your uterus contract and to control bleeding, your health care provider may: ? Give you medicine by injection, through an IV tube, by mouth, or through your rectum (rectally). ? Massage your abdomen or perform a vaginal exam to remove any blood clots that are left in your uterus. ? Empty your bladder by placing a thin, flexible tube (catheter) into your bladder. ? Encourage   you to breastfeed your baby. After labor is over, you and your baby will be monitored closely to ensure that you are both healthy until you are ready to go home. Your health care team will teach you how to care for yourself and your baby. This information is not intended to replace advice given to you by your health care provider. Make sure you discuss any questions you have with your health care provider. Document Released: 01/09/2008 Document Revised: 10/20/2015 Document Reviewed: 04/16/2015 Elsevier Interactive Patient Education  2018 [employer]  

## 2017-08-15 NOTE — Progress Notes (Signed)
Routine Prenatal Care Visit  Subjective  Miranda King is a 22 y.o. G3P1011 at [redacted]w[redacted]d being seen today for ongoing prenatal care.  She is currently monitored for the following issues for this high-risk pregnancy and has Asthma; Tobacco use disorder; Seizure disorder (HCC); Supervision of high risk pregnancy, antepartum, third trimester; Limited prenatal care in second trimester; History of tattoo; Opioid use; and Substance abuse affecting pregnancy, antepartum on their problem list.  ----------------------------------------------------------------------------------- Patient reports no complaints.  She admits taking methadone a couple weeks ago and denies daily use. Contractions: Not present. Vag. Bleeding: None.  Movement: Present. Denies leaking of fluid.  ----------------------------------------------------------------------------------- The following portions of the patient's history were reviewed and updated as appropriate: allergies, current medications, past family history, past medical history, past social history, past surgical history and problem list. Problem list updated.   Objective  Blood pressure 104/74, weight 158 lb (71.7 kg), last menstrual period 11/23/2016. Pregravid weight 115 lb (52.2 kg) Total Weight Gain 43 lb (19.5 kg) Urinalysis: Urine Protein: Negative Urine Glucose: Negative  Fetal Status: Fetal Heart Rate (bpm): 133 Fundal Height: 38 cm Movement: Present     General:  Alert, oriented and cooperative. Patient is in no acute distress.  Skin: Skin is warm and dry. No rash noted.   Cardiovascular: Normal heart rate noted  Respiratory: Normal respiratory effort, no problems with respiration noted  Abdomen: Soft, gravid, appropriate for gestational age. Pain/Pressure: Absent     Pelvic:  Cervical exam deferred        Extremities: Normal range of motion.  Edema: None  Mental Status: Normal mood and affect. Normal behavior. Normal judgment and thought content.    Assessment   22 y.o. G3P1011 at [redacted]w[redacted]d by  08/30/2017, by Last Menstrual Period presenting for routine prenatal visit  Plan   Pregnancy #3 Problems (from 05/26/17 to present)    Problem Noted Resolved   Substance abuse affecting pregnancy, antepartum 07/25/2017 by Vena Austria, MD No   Overview Signed 07/25/2017 11:18 AM by Vena Austria, MD    Methadone positive UDS on 07/24/2017      Opioid use 07/24/2017 by Conard Novak, MD No   Supervision of high risk pregnancy, antepartum, third trimester 05/27/2017 by Natale Milch, MD No   Overview Addendum 08/08/2017  1:57 PM by Oswaldo Conroy, CNM      [facility]inic [facility] Prenatal Labs  Dating  LMP= 7wk [place] Blood type:   O Positive  Genetic Screen AFP: 0.74     Quad: Negative, see medical records     Antibody:  negative`  Anatomic [place] Normal female (completed on 28 week follow up) Rubella: Immune (11/07 0000) Varicella: Unknown  GTT     RPR: Nonreactive (11/07 0000)   Rhogam N/A HBsAg: Negative (11/07 0000)   TDaP vaccine 08/08/17                     HIV: Non-reactive (11/07 0000)   Flu Shot October 2018 per patient                           GBS:   Contraception  IUD WGN:FAOZH HPV+  CBB     CS/VBAC  not applicable   Baby Food  Breast/Bottle   Support Person             Limited prenatal care in second trimester 05/27/2017 by Natale Milch, MD No       Term  labor symptoms and general obstetric precautions including but not limited to vaginal bleeding, contractions, leaking of fluid and fetal movement were reviewed in detail with the patient. Please refer to After Visit Summary for other counseling recommendations.   Return in about 1 week (around 08/22/2017) for rob.  Tresea Mall, CNM 08/15/2017 11:57 AM

## 2017-08-15 NOTE — Progress Notes (Signed)
No vb. No lof.  

## 2017-08-22 ENCOUNTER — Encounter: Payer: Self-pay | Admitting: *Deleted

## 2017-08-22 ENCOUNTER — Inpatient Hospital Stay: Payer: Medicaid Other | Admitting: Anesthesiology

## 2017-08-22 ENCOUNTER — Ambulatory Visit (INDEPENDENT_AMBULATORY_CARE_PROVIDER_SITE_OTHER): Payer: Medicaid Other | Admitting: Obstetrics and Gynecology

## 2017-08-22 ENCOUNTER — Inpatient Hospital Stay
Admission: RE | Admit: 2017-08-22 | Discharge: 2017-08-25 | DRG: 806 | Disposition: A | Discharge status: Home / Self Care | Payer: Medicaid Other | Source: Ambulatory Visit | Attending: Obstetrics and Gynecology | Admitting: Obstetrics and Gynecology

## 2017-08-22 ENCOUNTER — Other Ambulatory Visit: Payer: Self-pay

## 2017-08-22 ENCOUNTER — Encounter: Payer: Self-pay | Admitting: Obstetrics and Gynecology

## 2017-08-22 ENCOUNTER — Encounter: Payer: Medicaid Other | Admitting: Maternal Newborn

## 2017-08-22 VITALS — BP 120/80 | Wt 158.0 lb

## 2017-08-22 DIAGNOSIS — F1290 Cannabis use, unspecified, uncomplicated: Secondary | ICD-10-CM | POA: Diagnosis present

## 2017-08-22 DIAGNOSIS — J45909 Unspecified asthma, uncomplicated: Secondary | ICD-10-CM | POA: Diagnosis present

## 2017-08-22 DIAGNOSIS — O99334 Smoking (tobacco) complicating childbirth: Secondary | ICD-10-CM | POA: Diagnosis present

## 2017-08-22 DIAGNOSIS — O9081 Anemia of the puerperium: Secondary | ICD-10-CM | POA: Diagnosis not present

## 2017-08-22 DIAGNOSIS — O0993 Supervision of high risk pregnancy, unspecified, third trimester: Secondary | ICD-10-CM

## 2017-08-22 DIAGNOSIS — O0933 Supervision of pregnancy with insufficient antenatal care, third trimester: Secondary | ICD-10-CM

## 2017-08-22 DIAGNOSIS — Z3A38 38 weeks gestation of pregnancy: Secondary | ICD-10-CM

## 2017-08-22 DIAGNOSIS — F17200 Nicotine dependence, unspecified, uncomplicated: Secondary | ICD-10-CM | POA: Diagnosis present

## 2017-08-22 DIAGNOSIS — O99324 Drug use complicating childbirth: Secondary | ICD-10-CM | POA: Diagnosis present

## 2017-08-22 DIAGNOSIS — D62 Acute posthemorrhagic anemia: Secondary | ICD-10-CM | POA: Diagnosis not present

## 2017-08-22 DIAGNOSIS — O99320 Drug use complicating pregnancy, unspecified trimester: Secondary | ICD-10-CM

## 2017-08-22 DIAGNOSIS — O9952 Diseases of the respiratory system complicating childbirth: Secondary | ICD-10-CM | POA: Diagnosis present

## 2017-08-22 DIAGNOSIS — F1190 Opioid use, unspecified, uncomplicated: Secondary | ICD-10-CM

## 2017-08-22 DIAGNOSIS — O368130 Decreased fetal movements, third trimester, not applicable or unspecified: Secondary | ICD-10-CM | POA: Diagnosis present

## 2017-08-22 DIAGNOSIS — Z3483 Encounter for supervision of other normal pregnancy, third trimester: Secondary | ICD-10-CM | POA: Diagnosis present

## 2017-08-22 LAB — URINE DRUG SCREEN, QUALITATIVE (ARMC ONLY)
Amphetamines, Ur Screen: NOT DETECTED
BARBITURATES, UR SCREEN: NOT DETECTED
BENZODIAZEPINE, UR SCRN: NOT DETECTED
Cannabinoid 50 Ng, Ur ~~LOC~~: POSITIVE — AB
Cocaine Metabolite,Ur ~~LOC~~: NOT DETECTED
MDMA (Ecstasy)Ur Screen: NOT DETECTED
METHADONE SCREEN, URINE: NOT DETECTED
OPIATE, UR SCREEN: NOT DETECTED
Phencyclidine (PCP) Ur S: NOT DETECTED
TRICYCLIC, UR SCREEN: NOT DETECTED

## 2017-08-22 LAB — TYPE AND SCREEN
ABO/RH(D): O POS
Antibody Screen: NEGATIVE

## 2017-08-22 LAB — RAPID HIV SCREEN (HIV 1/2 AB+AG)
HIV 1/2 Antibodies: NONREACTIVE
HIV-1 P24 ANTIGEN - HIV24: NONREACTIVE

## 2017-08-22 LAB — CBC
HCT: 35.5 % (ref 35.0–47.0)
HEMOGLOBIN: 12.3 g/dL (ref 12.0–16.0)
MCH: 30.2 pg (ref 26.0–34.0)
MCHC: 34.8 g/dL (ref 32.0–36.0)
MCV: 86.7 fL (ref 80.0–100.0)
Platelets: 183 10*3/uL (ref 150–440)
RBC: 4.09 MIL/uL (ref 3.80–5.20)
RDW: 14.6 % — AB (ref 11.5–14.5)
WBC: 5.3 10*3/uL (ref 3.6–11.0)

## 2017-08-22 MED ORDER — LACTATED RINGERS IV SOLN
INTRAVENOUS | Status: DC
  Administered 2017-08-22: 1000 mL via INTRAVENOUS

## 2017-08-22 MED ORDER — OXYTOCIN 40 UNITS IN LACTATED RINGERS INFUSION - SIMPLE MED
2.5000 [IU]/h | INTRAVENOUS | Status: DC
  Administered 2017-08-23: 2.5 [IU]/h via INTRAVENOUS

## 2017-08-22 MED ORDER — DIPHENHYDRAMINE HCL 50 MG/ML IJ SOLN: 12.5000 mg | INTRAMUSCULAR | Status: DC | PRN

## 2017-08-22 MED ORDER — OXYTOCIN BOLUS FROM INFUSION
500.0000 mL | Freq: Once | INTRAVENOUS | Status: AC
  Administered 2017-08-23: 500 mL via INTRAVENOUS

## 2017-08-22 MED ORDER — LIDOCAINE-EPINEPHRINE (PF) 1.5 %-1:200000 IJ SOLN
INTRAMUSCULAR | Status: DC | PRN
  Administered 2017-08-22: 3 mL via EPIDURAL

## 2017-08-22 MED ORDER — SODIUM CHLORIDE 0.9 % IV SOLN
INTRAVENOUS | Status: DC | PRN
  Administered 2017-08-22 (×2): 5 mL via EPIDURAL

## 2017-08-22 MED ORDER — EPHEDRINE 5 MG/ML INJ
10.0000 mg | INTRAVENOUS | Status: DC | PRN
  Filled 2017-08-22: qty 2

## 2017-08-22 MED ORDER — LACTATED RINGERS IV SOLN
INTRAVENOUS | Status: DC
  Administered 2017-08-22: 21:00:00 via INTRAVENOUS

## 2017-08-22 MED ORDER — PHENYLEPHRINE 40 MCG/ML (10ML) SYRINGE FOR IV PUSH (FOR BLOOD PRESSURE SUPPORT)
80.0000 ug | PREFILLED_SYRINGE | INTRAVENOUS | Status: DC | PRN
  Filled 2017-08-22: qty 5

## 2017-08-22 MED ORDER — ACETAMINOPHEN 325 MG PO TABS: 650.0000 mg | ORAL_TABLET | ORAL | Status: DC | PRN

## 2017-08-22 MED ORDER — FENTANYL 2.5 MCG/ML W/ROPIVACAINE 0.15% IN NS 100 ML EPIDURAL (ARMC)
12.0000 mL/h | EPIDURAL | Status: DC
  Administered 2017-08-22: 12 mL/h via EPIDURAL

## 2017-08-22 MED ORDER — OXYTOCIN 40 UNITS IN LACTATED RINGERS INFUSION - SIMPLE MED
INTRAVENOUS | Status: AC
  Filled 2017-08-22: qty 1000

## 2017-08-22 MED ORDER — LIDOCAINE HCL (PF) 1 % IJ SOLN: 30.0000 mL | INTRAMUSCULAR | Status: DC | PRN

## 2017-08-22 MED ORDER — LACTATED RINGERS IV SOLN
500.0000 mL | Freq: Once | INTRAVENOUS | Status: AC
  Administered 2017-08-22: 500 mL via INTRAVENOUS

## 2017-08-22 MED ORDER — LACTATED RINGERS IV SOLN: 500.0000 mL | INTRAVENOUS | Status: DC | PRN

## 2017-08-22 MED ORDER — FENTANYL 2.5 MCG/ML W/ROPIVACAINE 0.15% IN NS 100 ML EPIDURAL (ARMC)
EPIDURAL | Status: AC
  Filled 2017-08-22: qty 100

## 2017-08-22 MED ORDER — ONDANSETRON HCL 4 MG/2ML IJ SOLN: 4.0000 mg | Freq: Four times a day (QID) | INTRAMUSCULAR | Status: DC | PRN

## 2017-08-22 MED ORDER — LIDOCAINE HCL (PF) 1 % IJ SOLN
INTRAMUSCULAR | Status: DC | PRN
  Administered 2017-08-22: 1 mL via INTRADERMAL

## 2017-08-22 NOTE — Progress Notes (Signed)
Routine Prenatal Care Visit  Subjective  Miranda King is a 22 y.o. G3P1011 at [redacted]w[redacted]d being seen today for ongoing prenatal care.  She is currently monitored for the following issues for this high-risk pregnancy and has Asthma; Tobacco use disorder; Seizure disorder (HCC); Supervision of high risk pregnancy, antepartum, third trimester; Limited prenatal care in second trimester; History of tattoo; Opioid use; and Substance abuse affecting pregnancy, antepartum on their problem list.  ----------------------------------------------------------------------------------- Patient reports decreased fetal movement for the last two days and loss of her mucus plug today. Contractions: Irregular. Vag. Bleeding: Bloody Show.  Movement: (!) Decreased. Denies leaking of fluid.  ----------------------------------------------------------------------------------- The following portions of the patient's history were reviewed and updated as appropriate: allergies, current medications, past family history, past medical history, past social history, past surgical history and problem list. Problem list updated.   Objective  Blood pressure 120/80, weight 158 lb (71.7 kg), last menstrual period 11/23/2016. Pregravid weight 115 lb (52.2 kg) Total Weight Gain 43 lb (19.5 kg) Urinalysis:      Fetal Status: Fetal Heart Rate (bpm): 141 Fundal Height: 38 cm Movement: (!) Decreased     General:  Alert, oriented and cooperative. Patient is in no acute distress.  Skin: Skin is warm and dry. No rash noted.   Cardiovascular: Normal heart rate noted  Respiratory: Normal respiratory effort, no problems with respiration noted  Abdomen: Soft, gravid, appropriate for gestational age. Pain/Pressure: Present     Pelvic:  Cervical exam deferred.        Extremities: Normal range of motion.  Edema: None  ental Status: Normal mood and affect. Normal behavior. Normal judgment and thought content.   NST: Baseline 145 bpm.  Moderate variability, one possible deceleration to the 60s but broken. One 10x10 acceleration. Category II tracing.  Assessment   22 y.o. G3P1011 at [redacted]w[redacted]d by  08/30/2017, by Last Menstrual Period presenting for routine prenatal visit  Plan   Pregnancy #3 Problems (from 05/26/17 to present)    Problem Noted Resolved   Substance abuse affecting pregnancy, antepartum 07/25/2017 by Vena Austria, MD No   Overview Signed 07/25/2017 11:18 AM by Vena Austria, MD    Methadone positive UDS on 07/24/2017      Opioid use 07/24/2017 by Conard Novak, MD No   Supervision of high risk pregnancy, antepartum, third trimester 05/27/2017 by Natale Milch, MD No   Overview Addendum 08/22/2017  1:43 PM by Natale Milch, MD      [facility]inic [facility] Prenatal Labs  Dating  LMP= 7wk [place] Blood type:   O Positive  Genetic Screen AFP: 0.74     Quad: Negative, see medical records     Antibody:  negative`  Anatomic [place] Normal female (completed on 28 week follow up) Rubella: Immune (11/07 0000) Varicella: Unknown  GTT    Negative RPR: Non Reactive (02/28 1040)   Rhogam N/A HBsAg: Negative (11/07 0000)   TDaP vaccine 08/08/17                     HIV: Non Reactive (02/28 1040)   Flu Shot October 2018 per patient                           [id number] (04/26 1355)  Contraception  IUD [id number] HPV+  CBB     CS/VBAC  not applicable   Baby Food  Breast/Bottle   Support Person  Limited prenatal care in second trimester 05/27/2017 by Natale Milch, MD No       Gestational age appropriate obstetric precautions including but not limited to vaginal bleeding, contractions, leaking of fluid and fetal movement were reviewed in detail with the patient.    Patient appears visually high in the office. Will obtain UDS today.  NST today in office, non-reactive, patient sent to labor and delivery for prolonged monitoring.   Return in about 1 week (around 08/29/2017) for  ROB.   Adelene Idler MD [facility], [facility] 08/22/17 1:52 PM

## 2017-08-22 NOTE — H&P (Signed)
OB History & Physical   History of Present Illness:  Chief Complaint: contractions  HPI:  Miranda King is a 22 y.o. G94P1011 female at [redacted]w[redacted]d dated by LMP.  Her pregnancy has been complicated by asthma, tobacco use disorder, seizure disorder, limited prenatal care, history of opiod use, substance abuse.    She reports contractions.   She denies leakage of fluid.   She denies vaginal bleeding.   She reports fetal movement. She was sent to the hospital from the office today for evaluation due to decreased fetal movement and non-reactive NST. A plan was made to keep patient for observation/hydration/monitoring and induction of labor after midnight. Patient cervix was checked when she arrived at hospital and was 3/80/-1. After re-check at 8:40 this evening she had changed to 6/90/0. Patient admitted for labor at this time.   Maternal Medical History:   Past Medical History:  Diagnosis Date  . Asthma   . Seizures (HCC)   . Tobacco use disorder     Past Surgical History:  Procedure Laterality Date  . DILATION AND EVACUATION N/A 10/03/2015   Procedure: DILATATION AND EVACUATION;  Surgeon: Nadara Mustard, MD;  Location: [facility] ORS;  Service: Gynecology;  Laterality: N/A;  . VAGINAL DELIVERY      Allergies  Allergen Reactions  . Tramadol Hcl Nausea And Vomiting    Prior to Admission medications   Medication Sig Start Date End Date Taking? Authorizing Provider  Prenatal Multivit-Min-Fe-FA (PRENATAL VITAMINS) 0.8 MG tablet Take 1 tablet by mouth daily. 05/27/17  Yes Schuman, Christanna R, MD  albuterol (PROVENTIL HFA;VENTOLIN HFA) 108 (90 Base) MCG/ACT inhaler Inhale 2 puffs into the lungs every 6 (six) hours as needed for wheezing or shortness of breath. Patient not taking: Reported on 07/24/2017 03/28/17   Willy Eddy, MD  ferrous sulfate 325 (65 FE) MG tablet Take 1 tablet (325 mg total) by mouth daily with breakfast. 03/28/17 04/27/17  Willy Eddy, MD    OB History   Gravida Para Term Preterm AB Living  SAB TAB Ectopic Multiple Live Births    1     1    # Outcome Date GA Lbr Len/2nd Weight Sex Delivery Anes PTL Lv  3 Current           2 Term 07/05/12    F Vag-Spont     1 TAB             Prenatal care site: [facility]  Social History: She  reports that she has been smoking.  She has never used smokeless tobacco. She reports that she does not drink alcohol or use drugs.  Family History: family history includes Asthma in her sister; Diabetes in her maternal grandmother; Hypertension in her maternal grandmother; Migraines in her mother.    Review of Systems: Negative x 10 systems reviewed except as noted in the HPI.    Physical Exam:  Vital Signs: BP 115/72 (BP Location: Left Arm)   Pulse 88   Temp 98.4 F (36.9 C) (Oral)   Resp 16   Ht  (1.676 m)   Wt 158 lb (71.7 kg)   LMP 11/23/2016 Comment: inconsistent spotting with IUD  BMI 25.50 kg/m  Constitutional: Well nourished, well developed female in no acute distress.  HEENT: normal Skin: Warm and dry.  Cardiovascular: Regular rate and rhythm.   Extremity: no edema  Respiratory: Clear to auscultation bilateral. Normal respiratory effort Abdomen: gravid, FHT present Back:  no CVAT Neuro: DTRs 2+, Cranial nerves grossly intact Psych: Alert and Oriented x3. No memory deficits. Normal mood and affect.  MS: normal gait, normal bilateral lower extremity ROM/strength/stability.  Pelvic exam:  is not limited by body habitus EGBUS: within normal limits Vagina: within normal limits and with normal mucosa  Cervix: 6/90/0   Pertinent Results:  Prenatal Labs: Blood type/Rh O positive  Antibody screen negative  Rubella Immune  Varicella Unknown    RPR Non-reactive  HBsAg negative  HIV negative  GC negative  Chlamydia negative  Genetic screening Not done  1 hour GTT 83  3 hour GTT NA  GBS negative on 4/26   Baseline FHR: 135 beats/min   Variability: moderate    Accelerations: present   Decelerations: absent Contractions: present frequency: every 2-6 Overall assessment: reassuring    Assessment:  Miranda King is a 22 y.o. G48P1011 female at [redacted]w[redacted]d with active labor.   Plan:  1. Admit to Labor & Delivery  2. CBC, T&S, Clrs, IVF 3. GBS negative.   4. Fetal well-being: Category I 5. Epidural as desired 6. Expectant management  Tresea Mall, CNM 08/22/2017 9:02 PM

## 2017-08-22 NOTE — Anesthesia Preprocedure Evaluation (Addendum)
Anesthesia Evaluation  Patient identified by MRN, date of birth, ID band Patient awake    Reviewed: Allergy & Precautions, H&P , NPO status , Patient's Chart, lab work & pertinent test results  History of Anesthesia Complications Negative for: history of anesthetic complications  Airway Mallampati: II  TM Distance: >3 FB Neck ROM: full    Dental  (+) Chipped   Pulmonary neg shortness of breath, asthma , Current Smoker,           Cardiovascular Exercise Tolerance: Good (-) hypertensionnegative cardio ROS       Neuro/Psych Seizures -,     GI/Hepatic negative GI ROS, (+)     substance abuse  marijuana use,   Endo/Other    Renal/GU   negative genitourinary   Musculoskeletal   Abdominal   Peds  Hematology negative hematology ROS (+)   Anesthesia Other Findings Past Medical History: No date: Asthma No date: Seizures (HCC) No date: Tobacco use disorder  Past Surgical History: 10/03/2015: DILATION AND EVACUATION; N/A     Comment:  Procedure: DILATATION AND EVACUATION;  Surgeon: Nadara Mustard, MD;  Location: [facility] ORS;  Service: Gynecology;                Laterality: N/A; No date: VAGINAL DELIVERY  BMI    Body Mass Index:  25.50 kg/m      Reproductive/Obstetrics (+) Pregnancy                           Anesthesia Physical Anesthesia Plan  ASA: III  Anesthesia Plan: Epidural   Post-op Pain Management:    Induction:   PONV Risk Score and Plan:   Airway Management Planned:   Additional Equipment:   Intra-op Plan:   Post-operative Plan:   Informed Consent: I have reviewed the patients History and Physical, chart, labs and discussed the procedure including the risks, benefits and alternatives for the proposed anesthesia with the patient or authorized representative who has indicated his/her understanding and acceptance.     Plan Discussed with:  Anesthesiologist  Anesthesia Plan Comments:         Anesthesia Quick Evaluation

## 2017-08-22 NOTE — Patient Instructions (Addendum)
Third Trimester of Pregnancy  The third trimester is from week 29 through week 42, months 7 through 9. This trimester is when your unborn baby (fetus) is growing very fast. At the end of the ninth month, the unborn baby is about 20 inches in length. It weighs about 6–10 pounds.  Follow these instructions at home:  · Avoid all smoking, herbs, and alcohol. Avoid drugs not approved by your doctor.  · Do not use any tobacco products, including cigarettes, chewing tobacco, and electronic cigarettes. If you need help quitting, ask your doctor. You may get counseling or other support to help you quit.  · Only take medicine as told by your doctor. Some medicines are safe and some are not during pregnancy.  · Exercise only as told by your doctor. Stop exercising if you start having cramps.  · Eat regular, healthy meals.  · Wear a good support bra if your breasts are tender.  · Do not use hot tubs, steam rooms, or saunas.  · Wear your seat belt when driving.  · Avoid raw meat, uncooked cheese, and liter boxes and soil used by cats.  · Take your prenatal vitamins.  · Take 1500–2000 milligrams of calcium daily starting at the 20th week of pregnancy until you deliver your baby.  · Try taking medicine that helps you poop (stool softener) as needed, and if your doctor approves. Eat more fiber by eating fresh fruit, vegetables, and whole grains. Drink enough fluids to keep your pee (urine) clear or pale yellow.  · Take warm water baths (sitz baths) to soothe pain or discomfort caused by hemorrhoids. Use hemorrhoid cream if your doctor approves.  · If you have puffy, bulging veins (varicose veins), wear support hose. Raise (elevate) your feet for 15 minutes, 3–4 times a day. Limit salt in your diet.  · Avoid heavy lifting, wear low heels, and sit up straight.  · Rest with your legs raised if you have leg cramps or low back pain.  · Visit your dentist if you have not gone during your pregnancy. Use a  soft toothbrush to brush your teeth. Be gentle when you floss.  · You can have sex (intercourse) unless your doctor tells you not to.  · Do not travel far distances unless you must. Only do so with your doctor's approval.  · Take prenatal classes.  · Practice driving to the hospital.  · Pack your hospital bag.  · Prepare the baby's room.  · Go to your doctor visits.  Get help if:  · You are not sure if you are in labor or if your water has broken.  · You are dizzy.  · You have mild cramps or pressure in your lower belly (abdominal).  · You have a nagging pain in your belly area.  · You continue to feel sick to your stomach (nauseous), throw up (vomit), or have watery poop (diarrhea).  · You have bad smelling fluid coming from your vagina.  · You have pain with peeing (urination).  Get help right away if:  · You have a fever.  · You are leaking fluid from your vagina.  · You are spotting or bleeding from your vagina.  · You have severe belly cramping or pain.  · You lose or gain weight rapidly.  · You have trouble catching your breath and have chest pain.  · You notice sudden or extreme puffiness (swelling) of your face, hands, ankles, feet, or legs.  · You have   not felt the baby move in over an hour.  · You have severe headaches that do not go away with medicine.  · You have vision changes.  This information is not intended to replace advice given to you by your health care provider. Make sure you discuss any questions you have with your health care provider.  Document Released: 06/26/2009 Document Revised: 09/07/2015 Document Reviewed: 06/02/2012  Elsevier Interactive Patient Education © 2017 [employer]          Fetal Movement Counts  Patient Name: ________________________________________________ Patient Due Date: ____________________  What is a fetal movement count?  A fetal movement count is the number of times that you feel your baby move during a certain amount of time. This may also be called a fetal kick  count. A fetal movement count is recommended for every pregnant woman. You may be asked to start counting fetal movements as early as week 28 of your pregnancy.  Pay attention to when your baby is most active. You may notice your baby's sleep and wake cycles. You may also notice things that make your baby move more. You should do a fetal movement count:  · When your baby is normally most active.  · At the same time each day.    A good time to count movements is while you are resting, after having something to eat and drink.  How do I count fetal movements?  1. Find a quiet, comfortable area. Sit, or lie down on your side.  2. Write down the date, the start time and stop time, and the number of movements that you felt between those two times. Take this information with you to your health care visits.  3. For 2 hours, count kicks, flutters, swishes, rolls, and jabs. You should feel at least 10 movements during 2 hours.  4. You may stop counting after you have felt 10 movements.  5. If you do not feel 10 movements in 2 hours, have something to eat and drink. Then, keep resting and counting for 1 hour. If you feel at least 4 movements during that hour, you may stop counting.  Contact a health care provider if:  · You feel fewer than 4 movements in 2 hours.  · Your baby is not moving like he or she usually does.  Date: ____________ Start time: ____________ Stop time: ____________ Movements: ____________  Date: ____________ Start time: ____________ Stop time: ____________ Movements: ____________  Date: ____________ Start time: ____________ Stop time: ____________ Movements: ____________  Date: ____________ Start time: ____________ Stop time: ____________ Movements: ____________  Date: ____________ Start time: ____________ Stop time: ____________ Movements: ____________  Date: ____________ Start time: ____________ Stop time: ____________ Movements: ____________   Date: ____________ Start time: ____________ Stop time: ____________ Movements: ____________  Date: ____________ Start time: ____________ Stop time: ____________ Movements: ____________  Date: ____________ Start time: ____________ Stop time: ____________ Movements: ____________  This information is not intended to replace advice given to you by your health care provider. Make sure you discuss any questions you have with your health care provider.  Document Released: 05/01/2006 Document Revised: 11/29/2015 Document Reviewed: 05/11/2015  Elsevier Interactive Patient Education © 2018 Elsevier Inc[employer]

## 2017-08-22 NOTE — Anesthesia Procedure Notes (Signed)
Epidural Patient location during procedure: OB Start time: 08/22/2017 9:17 PM End time: 08/22/2017 9:23 PM  Staffing Anesthesiologist: Piscitello, Cleda Mccreedy, MD Performed: anesthesiologist   Preanesthetic Checklist Completed: patient identified, site marked, surgical consent, pre-op evaluation, timeout performed, IV checked, risks and benefits discussed and monitors and equipment checked  Epidural Patient position: sitting Prep: ChloraPrep Patient monitoring: heart rate, continuous pulse ox and blood pressure Approach: midline Location: L3-L4 Injection technique: LOR saline  Needle:  Needle type: Tuohy  Needle gauge: 17 G Needle length: 9 cm and 9 Needle insertion depth: 5 cm Catheter type: closed end flexible Catheter size: 19 Gauge Catheter at skin depth: 10 cm Test dose: negative and 1.5% lidocaine with Epi 1:200 K  Assessment Sensory level: T10 Events: blood not aspirated, injection not painful, no injection resistance, negative IV test and no paresthesia  Additional Notes 1 attempt Pt. Evaluated and documentation done after procedure finished. Patient identified. Risks/Benefits/Options discussed with patient including but not limited to bleeding, infection, nerve damage, paralysis, failed block, incomplete pain control, headache, blood pressure changes, nausea, vomiting, reactions to medication both or allergic, itching and postpartum back pain. Confirmed with bedside nurse the patient's most recent platelet count. Confirmed with patient that they are not currently taking any anticoagulation, have any bleeding history or any family history of bleeding disorders. Patient expressed understanding and wished to proceed. All questions were answered. Sterile technique was used throughout the entire procedure. Please see nursing notes for vital signs. Test dose was given through epidural catheter and negative prior to continuing to dose epidural or start infusion. Warning signs of  high block given to the patient including shortness of breath, tingling/numbness in hands, complete motor block, or any concerning symptoms with instructions to call for help. Patient was given instructions on fall risk and not to get out of bed. All questions and concerns addressed with instructions to call with any issues or inadequate analgesia.   Patient tolerated the insertion well without immediate complications.Reason for block:procedure for pain

## 2017-08-23 DIAGNOSIS — Z3A38 38 weeks gestation of pregnancy: Secondary | ICD-10-CM

## 2017-08-23 LAB — RPR: RPR: NONREACTIVE

## 2017-08-23 LAB — CBC
HCT: 33.7 % — ABNORMAL LOW (ref 35.0–47.0)
HEMOGLOBIN: 11.7 g/dL — AB (ref 12.0–16.0)
MCH: 30.3 pg (ref 26.0–34.0)
MCHC: 34.7 g/dL (ref 32.0–36.0)
MCV: 87.5 fL (ref 80.0–100.0)
Platelets: 176 10*3/uL (ref 150–440)
RBC: 3.86 MIL/uL (ref 3.80–5.20)
RDW: 14.2 % (ref 11.5–14.5)
WBC: 9.2 10*3/uL (ref 3.6–11.0)

## 2017-08-23 MED ORDER — SIMETHICONE 80 MG PO CHEW: 80.0000 mg | CHEWABLE_TABLET | ORAL | Status: DC | PRN

## 2017-08-23 MED ORDER — DIPHENHYDRAMINE HCL 25 MG PO CAPS: 25.0000 mg | ORAL_CAPSULE | Freq: Four times a day (QID) | ORAL | Status: DC | PRN

## 2017-08-23 MED ORDER — BENZOCAINE-MENTHOL 20-0.5 % EX AERO: 1.0000 "application " | INHALATION_SPRAY | CUTANEOUS | Status: DC | PRN

## 2017-08-23 MED ORDER — ACETAMINOPHEN 325 MG PO TABS
650.0000 mg | ORAL_TABLET | ORAL | Status: DC | PRN
  Administered 2017-08-23 – 2017-08-24 (×7): 650 mg via ORAL
  Filled 2017-08-23 (×6): qty 2

## 2017-08-23 MED ORDER — COCONUT OIL OIL: 1.0000 "application " | TOPICAL_OIL | Status: DC | PRN

## 2017-08-23 MED ORDER — WITCH HAZEL-GLYCERIN EX PADS: 1.0000 "application " | MEDICATED_PAD | CUTANEOUS | Status: DC | PRN

## 2017-08-23 MED ORDER — DIBUCAINE 1 % RE OINT: 1.0000 "application " | TOPICAL_OINTMENT | RECTAL | Status: DC | PRN

## 2017-08-23 MED ORDER — IBUPROFEN 600 MG PO TABS
600.0000 mg | ORAL_TABLET | Freq: Four times a day (QID) | ORAL | Status: DC
  Administered 2017-08-23 – 2017-08-25 (×11): 600 mg via ORAL
  Filled 2017-08-23 (×11): qty 1

## 2017-08-23 MED ORDER — ONDANSETRON HCL 4 MG PO TABS: 4.0000 mg | ORAL_TABLET | ORAL | Status: DC | PRN

## 2017-08-23 MED ORDER — ONDANSETRON HCL 4 MG/2ML IJ SOLN: 4.0000 mg | INTRAMUSCULAR | Status: DC | PRN

## 2017-08-23 MED ORDER — SENNOSIDES-DOCUSATE SODIUM 8.6-50 MG PO TABS
2.0000 | ORAL_TABLET | ORAL | Status: DC
  Administered 2017-08-24 – 2017-08-25 (×2): 2 via ORAL
  Filled 2017-08-23 (×2): qty 2

## 2017-08-23 MED ORDER — ACETAMINOPHEN 325 MG PO TABS
ORAL_TABLET | ORAL | Status: AC
  Filled 2017-08-23: qty 2

## 2017-08-23 MED ORDER — PRENATAL MULTIVITAMIN CH
1.0000 | ORAL_TABLET | Freq: Every day | ORAL | Status: DC
  Administered 2017-08-23 – 2017-08-25 (×3): 1 via ORAL
  Filled 2017-08-23 (×3): qty 1

## 2017-08-23 NOTE — Discharge Summary (Addendum)
OB Discharge Summary     Patient Name: Miranda King DOB: March 15, 1996 MRN: [id number]  Date of admission: 08/22/2017 Delivering MD: Tresea Mall, CNM  Date of Delivery: 08/23/2017  Date of discharge: 08/25/2017  Admitting diagnosis: 1 Intrauterine pregnancy: [redacted]w[redacted]d     Secondary diagnosis: None     Discharge diagnosis: Term Pregnancy Delivered                                                                                                Post partum procedures: none  Augmentation: none  Complications: None  Hospital course:  Onset of Labor With Vaginal Delivery     22 y.o. yo G3P1011 at [redacted]w[redacted]d was admitted in Active Labor on 08/22/2017. Patient had an uncomplicated labor course as follows:  Membrane Rupture Time/Date: 12:20 AM ,08/23/2017   Patient had delivery of viable female 12:37 AM, 08/23/2017 Details of delivery can be found in a separate note  Patient had an uncomplicated postpartum course.   She is tolerating PO intake. Her pain is well controlled on PO medications. She is ambulating and voiding without difficulty.  Patient is discharged home in stable condition on 08/25/2017   Physical exam  Vitals:   08/24/17 0752 08/24/17 1547 08/24/17 2351 08/25/17 0746  BP: 128/84 113/73 116/65 (!) 106/55  Pulse: 67 72 61 66  Resp: Temp: 97.7 F (36.5 C) 98 F (36.7 C) 98.4 F (36.9 C) 98.1 F (36.7 C)  TempSrc: Oral Oral Oral Oral  SpO2: 100% 100% 99% 99%  Weight:      Height:       General: alert, cooperative and no distress Lochia: appropriate Uterine Fundus: firm Incision: N/A DVT Evaluation: No evidence of DVT seen on physical exam.  Labs: Lab Results  Component Value Date   WBC 9.2 08/23/2017   HGB 11.7 (L) 08/23/2017   HCT 33.7 (L) 08/23/2017   MCV 87.5 08/23/2017   PLT 176 08/23/2017    Discharge instruction: per After Visit Summary.  Medications:  Allergies as of 08/25/2017      Reactions   Tramadol Hcl Nausea And Vomiting       Medication List    STOP taking these medications   albuterol 108 (90 Base) MCG/ACT inhaler Commonly known as:  PROVENTIL HFA;VENTOLIN HFA   ferrous sulfate 325 (65 FE) MG tablet     TAKE these medications   Prenatal Vitamins 0.8 MG tablet Take 1 tablet by mouth daily.       Diet: routine diet  Activity: Advance as tolerated. Pelvic rest for 6 weeks.   Outpatient follow up: Follow-up Information    Tresea Mall, CNM. Schedule an appointment as soon as possible for a visit in 6 weeks.   Specialty:  Obstetrics Why:  postpartum follow up visit Contact information: [address] [place] [address] [phone]             Postpartum contraception: planning Nexplanon Rhogam Given postpartum: NA Rubella vaccine given postpartum: Rubella Immune Varicella vaccine given postpartum: awaiting results of varicella antibody TDaP given antepartum or postpartum: given antepartum  Newborn  Data: Live born female Malachi Birth Weight: 3190 g  APGAR: 8, 9  Newborn Delivery   Birth date/time:  08/23/2017 00:37:00 Delivery type:  Vaginal, Spontaneous      Baby Feeding: Bottle/pumping and dumping until milk is clear of MJ  Disposition:home with mother  SIGNED:  Tresea Mall, CNM 08/25/2017 10:13 AM

## 2017-08-23 NOTE — Progress Notes (Signed)
PPD#0 SVD Subjective:  Sitting up in bed, feeding baby.  Pain control is good. Voiding without difficulty. Tolerating a regular diet. Ambulating well.  Objective:   Blood pressure 111/61, pulse 97, temperature 97.7 F (36.5 C), temperature source Oral, resp. rate 18, height  (1.676 m), weight 158 lb (71.7 kg), last menstrual period 11/23/2016, SpO2 100 %, currently breastfeeding.  General: NAD Pulmonary: no increased work of breathing Abdomen: non-distended, non-tender Uterus:  fundus firm at U/1; lochia rubra small Laceration: N/A Extremities: no edema, no erythema, no tenderness, no signs of DVT  Results for orders placed or performed during the hospital encounter of 08/22/17 (from the past 72 hour(s))  Urine Drug Screen, Qualitative ([facility] only)     Status: Abnormal   Collection Time: 08/22/17  3:18 PM  Result Value Ref Range   Tricyclic, Ur Screen NONE DETECTED NONE DETECTED   Amphetamines, Ur Screen NONE DETECTED NONE DETECTED   MDMA (Ecstasy)Ur Screen NONE DETECTED NONE DETECTED   Cocaine Metabolite,Ur [place] NONE DETECTED NONE DETECTED   Opiate, Ur Screen NONE DETECTED NONE DETECTED   Phencyclidine (PCP) Ur S NONE DETECTED NONE DETECTED   Cannabinoid 50 Ng, Ur [place] POSITIVE (A) NONE DETECTED   Barbiturates, Ur Screen NONE DETECTED NONE DETECTED   Benzodiazepine, Ur Scrn NONE DETECTED NONE DETECTED   Methadone Scn, Ur NONE DETECTED NONE DETECTED    Comment: (NOTE) Tricyclics + metabolites, urine    Cutoff 1000 ng/mL Amphetamines + metabolites, urine  Cutoff 1000 ng/mL MDMA (Ecstasy), urine              Cutoff 500 ng/mL Cocaine Metabolite, urine          Cutoff 300 ng/mL Opiate + metabolites, urine        Cutoff 300 ng/mL Phencyclidine (PCP), urine         Cutoff 25 ng/mL Cannabinoid, urine                 Cutoff 50 ng/mL Barbiturates + metabolites, urine  Cutoff 200 ng/mL Benzodiazepine, urine              Cutoff 200 ng/mL Methadone, urine                   Cutoff 300  ng/mL The urine drug screen provides only a preliminary, unconfirmed analytical test result and should not be used for non-medical purposes. Clinical consideration and professional judgment should be applied to any positive drug screen result due to possible interfering substances. A more specific alternate chemical method must be used in order to obtain a confirmed analytical result. Gas chromatography / mass spectrometry (GC/MS) is the preferred confirmat ory method. Performed at [facility], [address] [place], [place] [address]   CBC     Status: Abnormal   Collection Time: 08/22/17  3:27 PM  Result Value Ref Range   WBC 5.3 3.6 - 11.0 K/uL   RBC 4.09 3.80 - 5.20 MIL/uL   Hemoglobin 12.3 12.0 - 16.0 g/dL   HCT 60.4 54.0 - 98.1 %   MCV 86.7 80.0 - 100.0 fL   MCH 30.2 26.0 - 34.0 pg   MCHC 34.8 32.0 - 36.0 g/dL   RDW 19.1 (H) 47.8 - 29.5 %   Platelets 183 150 - 440 K/uL    Comment: Performed at [facility], [address] [place], [place] [address]  RPR     Status: None   Collection Time: 08/22/17  3:27 PM  Result Value  Ref Range   RPR Ser Ql Non Reactive Non Reactive    Comment: (NOTE) Performed At: [facility] [address] [place], [place] [id number] Jolene Schimke MD [id number] Performed at [facility], [address] [place], [place] [address]   Type and screen     Status: None   Collection Time: 08/22/17  3:27 PM  Result Value Ref Range   ABO/RH(D) O POS    Antibody Screen NEG    Sample Expiration      08/25/2017 Performed at [facility], [address] [place], [place] [address]   Rapid HIV screen (HIV 1/2 Ab+Ag)     Status: None   Collection Time: 08/22/17  3:27 PM  Result Value Ref Range   HIV-1 P24 Antigen - HIV24 NON REACTIVE NON REACTIVE   HIV 1/2 Antibodies NON REACTIVE NON REACTIVE   Interpretation (HIV Ag Ab)      A non reactive test result means that HIV 1 or HIV 2 antibodies and HIV  1 p24 antigen were not detected in the specimen.    Comment: Performed at [facility], [address] [place], [place] [address]  CBC     Status: Abnormal   Collection Time: 08/23/17  6:17 AM  Result Value Ref Range   WBC 9.2 3.6 - 11.0 K/uL   RBC 3.86 3.80 - 5.20 MIL/uL   Hemoglobin 11.7 (L) 12.0 - 16.0 g/dL   HCT 62.9 (L) 52.8 - 41.3 %   MCV 87.5 80.0 - 100.0 fL   MCH 30.3 26.0 - 34.0 pg   MCHC 34.7 32.0 - 36.0 g/dL   RDW 24.4 01.0 - 27.2 %   Platelets 176 150 - 440 K/uL    Comment: Performed at [facility], [address] [place], [place] [address]    Assessment:   22 y.o. [id number] postpartum day #0 in good condition.  Plan:   1) Acute blood loss anemia - hemodynamically stable and asymptomatic  2) Blood Type --/--/O POS (05/10 1527)   3) Rubella Immune (11/07 0000) / Varicella unknown. TDAP status: given 08/08/17.  4) Breast feeding  5) Contraception: not discussed  6) Disposition: continue postpartum care. Social Work consult for positive drug screen on arrival to Labor and Delivery.  Marcelyn Bruins, CNM 08/23/2017  12:12 PM

## 2017-08-23 NOTE — Progress Notes (Signed)
Patient vitals stable and WDL today. Patient tolerating PO food and fluids well. Fundus firm, bleeding small. Patient pain ranging between 0 and 5/10. Pain being controlled with oral pain meds. Patient voiding appropriately. Patient stooled today as well.  Infant still very sleepy and not wanting to feed. Patient and RN discussed attempting to supplement infant with formula. Patient agrees with plan. RN educated patient about bottle feeding and formula preparation. RN bottle fed infant. Infant does not seem to have a very strong suck and took 7ml. Patient to continue to attempt breastfeeding every 2-3 hours. Patient also interested in pumping. RN set up pump and educated her about pumping. Patient verbalizes understanding of teaching and is currently pumping. Patient to call RN if she needs help.

## 2017-08-23 NOTE — Clinical Social Work Note (Signed)
CSW received consult that the MOB had a positive drug screen. CSW has discussed with the attending RN, and the CSW and RN agree that the patient needs to rest at this time due to a late night birth. CSW will visit later when more appropriate, either today or tomorrow, to complete the assessment. CSW is following.  Miranda King, MSW, Theresia Majors [phone]

## 2017-08-23 NOTE — Lactation Note (Signed)
This note was copied from a baby's chart. Lactation Consultation Note  Patient Name: Miranda King Date: 08/23/2017 Reason for consult: Initial assessment   Maternal Data    Feeding Feeding Type: Breast Fed Length of feed: 10 min  LATCH Score                   Interventions    Lactation Tools Discussed/Used     Consult Status Consult Status: PRN Mom prefers to nurse baby, but is positive for MJ. LC explained risk factors and instructed mom to pump and dump to establish milk supply until she has a negative drug screening. Mom is allowed to put baby to breast if she desires since [facility] explained risks.    Burnadette Peter 08/23/2017, 11:28 AM

## 2017-08-23 NOTE — Progress Notes (Signed)
Infant positive for marijuana and risks of breastfeeding have been explained by RN and LC. Patient verbalizes understanding of risks but chooses to breastfeed. Infant going 4-5 hours between feedings. RN educated mom about attempting infant feeding every 2-3 hours even if infant seems sleepy. Patient verbalizes understanding of teaching. RN to reeducate patient if feedings continue to be spaced out. RN instructed patient to call for help with feeding. Patient verbalizes understanding of teaching.

## 2017-08-24 NOTE — Progress Notes (Signed)
Period of purple cry video shown to patient and significant other. 

## 2017-08-24 NOTE — Clinical Social Work Note (Signed)
CSW attempted to visit the patient at bedside to assess and to discuss CPS mandated report needs. The patient was sleeping soundly. The CSW is aware of the positive UDS for the infant (methadone). The CSW will provide the incoming CSW with information to make a mandated report once the patient has a face-to-face assessment. CSW is following.  Miranda King, MSW, Theresia Majors [phone]

## 2017-08-24 NOTE — Progress Notes (Signed)
PPD#1 SVD Subjective:  Cheerful, has not gotten much rest.  Pain control is adequate with PRN medication. Voiding without difficulty. Tolerating a regular diet. Ambulating well.  Objective:   Blood pressure 128/84, pulse 67, temperature 97.7 F (36.5 C), temperature source Oral, resp. rate 17, height  (1.676 m), weight 158 lb (71.7 kg), last menstrual period 11/23/2016, SpO2 100 %, currently breastfeeding.  General: NAD  Pulmonary: no increased work of breathing Abdomen: non-distended, non-tender Uterus:  fundus firm at U/1; lochia rubra scant Laceration: N/A Extremities: no edema, no erythema, no tenderness, no signs of DVT  Results for orders placed or performed during the hospital encounter of 08/22/17 (from the past 72 hour(s))  Urine Drug Screen, Qualitative ([facility] only)     Status: Abnormal   Collection Time: 08/22/17  3:18 PM  Result Value Ref Range   Tricyclic, Ur Screen NONE DETECTED NONE DETECTED   Amphetamines, Ur Screen NONE DETECTED NONE DETECTED   MDMA (Ecstasy)Ur Screen NONE DETECTED NONE DETECTED   Cocaine Metabolite,Ur [employer] NONE DETECTED NONE DETECTED   Opiate, Ur Screen NONE DETECTED NONE DETECTED   Phencyclidine (PCP) Ur S NONE DETECTED NONE DETECTED   Cannabinoid 50 Ng, Ur [place] POSITIVE (A) NONE DETECTED   Barbiturates, Ur Screen NONE DETECTED NONE DETECTED   Benzodiazepine, Ur Scrn NONE DETECTED NONE DETECTED   Methadone Scn, Ur NONE DETECTED NONE DETECTED    Comment: (NOTE) Tricyclics + metabolites, urine    Cutoff 1000 ng/mL Amphetamines + metabolites, urine  Cutoff 1000 ng/mL MDMA (Ecstasy), urine              Cutoff 500 ng/mL Cocaine Metabolite, urine          Cutoff 300 ng/mL Opiate + metabolites, urine        Cutoff 300 ng/mL Phencyclidine (PCP), urine         Cutoff 25 ng/mL Cannabinoid, urine                 Cutoff 50 ng/mL Barbiturates + metabolites, urine  Cutoff 200 ng/mL Benzodiazepine, urine              Cutoff 200 ng/mL Methadone, urine                    Cutoff 300 ng/mL The urine drug screen provides only a preliminary, unconfirmed analytical test result and should not be used for non-medical purposes. Clinical consideration and professional judgment should be applied to any positive drug screen result due to possible interfering substances. A more specific alternate chemical method must be used in order to obtain a confirmed analytical result. Gas chromatography / mass spectrometry (GC/MS) is the preferred confirmat ory method. Performed at [facility], [address] [place], [place] [address]   CBC     Status: Abnormal   Collection Time: 08/22/17  3:27 PM  Result Value Ref Range   WBC 5.3 3.6 - 11.0 K/uL   RBC 4.09 3.80 - 5.20 MIL/uL   Hemoglobin 12.3 12.0 - 16.0 g/dL   HCT 60.4 54.0 - 98.1 %   MCV 86.7 80.0 - 100.0 fL   MCH 30.2 26.0 - 34.0 pg   MCHC 34.8 32.0 - 36.0 g/dL   RDW 19.1 (H) 47.8 - 29.5 %   Platelets 183 150 - 440 K/uL    Comment: Performed at [facility], [address], [place], [place] [address]  RPR     Status: None   Collection Time: 08/22/17  3:27  PM  Result Value Ref Range   RPR Ser Ql Non Reactive Non Reactive    Comment: (NOTE) Performed At: [facility] [address] [place], [place] [id number] Jolene Schimke MD QQ:[id number] Performed at [facility], [address] [place], [place] [address]   Type and screen     Status: None   Collection Time: 08/22/17  3:27 PM  Result Value Ref Range   ABO/RH(D) O POS    Antibody Screen NEG    Sample Expiration      08/25/2017 Performed at [facility], [address] [place], [place] [address]   Rapid HIV screen (HIV 1/2 Ab+Ag)     Status: None   Collection Time: 08/22/17  3:27 PM  Result Value Ref Range   HIV-1 P24 Antigen - HIV24 NON REACTIVE NON REACTIVE   HIV 1/2 Antibodies NON REACTIVE NON REACTIVE   Interpretation (HIV Ag Ab)      A non reactive test result means that HIV 1 or  HIV 2 antibodies and HIV 1 p24 antigen were not detected in the specimen.    Comment: Performed at [facility], [address] [place], [place] [address]  CBC     Status: Abnormal   Collection Time: 08/23/17  6:17 AM  Result Value Ref Range   WBC 9.2 3.6 - 11.0 K/uL   RBC 3.86 3.80 - 5.20 MIL/uL   Hemoglobin 11.7 (L) 12.0 - 16.0 g/dL   HCT 01.6 (L) 01.0 - 93.2 %   MCV 87.5 80.0 - 100.0 fL   MCH 30.3 26.0 - 34.0 pg   MCHC 34.7 32.0 - 36.0 g/dL   RDW 35.5 73.2 - 20.2 %   Platelets 176 150 - 440 K/uL    Comment: Performed at [facility], [address] [place], [place] [address]    Assessment:   22 y.o. [id number] postpartum day # 1 in good condition.  Plan:   1) Acute blood loss anemia - hemodynamically stable and asymptomatic  2) Blood Type --/--/O POS (05/10 1527)   3) Rubella Immune (11/07 0000) / Varicella pending / TDAP status given 08/08/17.  4) Breast and formula feeding.  5) Contraception - undecided  6) Disposition: continue postpartum care, consider discharge later today if newborn may discharge. Otherwise, anticipate discharge on PPD#2.  Marcelyn Bruins, CNM  08/24/2017  9:06 AM

## 2017-08-24 NOTE — Progress Notes (Signed)
Patient vitals stable and WDL. Patient pain ranging from 0/10 to 4/10. Patient pain controlled with oral pain meds. Fundus firm, bleeding small. Patient tolerating PO food and fluids. Ambulating well. Patient breast and bottle feeding infant. Patient also pumping. Infant to stay a patient for 3-5 days due to positive urine drug screen. Patient educated on [facility] policy. Patient verbalizes understanding of teaching.

## 2017-08-24 NOTE — Anesthesia Postprocedure Evaluation (Signed)
Anesthesia Post Note  Patient: Miranda King  Procedure(s) Performed: AN AD HOC LABOR EPIDURAL  Patient location during evaluation: Mother Baby Anesthesia Type: Epidural Level of consciousness: awake and alert and oriented Pain management: pain level controlled Vital Signs Assessment: post-procedure vital signs reviewed and stable Respiratory status: spontaneous breathing, nonlabored ventilation and respiratory function stable Cardiovascular status: stable Postop Assessment: no headache, no backache, epidural receding and no signs of nausea or vomiting (no pruritis) Anesthetic complications: no     Last Vitals:  Vitals:   08/24/17 0752 08/24/17 1547  BP: 128/84 113/73  Pulse: 67 72  Resp: 17 17  Temp: 36.5 C 36.7 C  SpO2: 100% 100%    Last Pain:  Vitals:   08/24/17 2050  TempSrc:   PainSc: 2                  Debrina Kizer

## 2017-08-25 LAB — VARICELLA ZOSTER ANTIBODY, IGG: VARICELLA IGG: 399 {index} (ref >165)

## 2017-08-25 NOTE — Progress Notes (Signed)
Patient discharged. Discharge instructions, prescriptions and follow up appointment given to and reviewed with patient. Patient verbalized understanding. Patient will room in with infant until 5/15.

## 2017-08-25 NOTE — Clinical Social Work Maternal (Signed)
CLINICAL SOCIAL WORK MATERNAL/CHILD NOTE  Patient Details  Name: Miranda King MRN: [id number] Date of Birth: [birth year]  Date:  08/25/2017  Clinical Social Worker Initiating Note:  ([place], [place] [phone] ) Date/Time: Initiated:  08/25/17/1533     Child's Name:  Miranda King )   Biological Parents:  Mother, Father   Need for Interpreter:  None   Reason for Referral:  Current Substance Use/Substance Use During Pregnancy    Address:  [place] [address]    Phone number:  [phone] (home)     Additional phone number:  Household Members/Support Persons (HM/SP):   Household Member/Support Person 1   HM/SP Name Relationship DOB or Age  HM/SP -1 Miranda King ) (boyfriend )    HM/SP -2        HM/SP -3        HM/SP -4        HM/SP -5        HM/SP -6        HM/SP -7        HM/SP -8          Natural Supports (not living in the home):  Parent   Professional Supports:     Employment: Animator   Type of Work:     Education:  Programmer, systems   Homebound arranged:    Museum/gallery curator Resources:  Medicaid   Other Resources:  [facility]   Cultural/Religious Considerations Which May Impact Care:   Strengths:  Ability to meet basic needs , Compliance with medical plan , Home prepared for child    Psychotropic Medications:         Pediatrician:       Pediatrician List:   [place]      Pediatrician Fax Number:    Risk Factors/Current Problems:  Substance Use    Cognitive State:  Goal Oriented , Able to Concentrate    Mood/Affect:  Relaxed , Calm    CSW Assessment: Clinical Education officer, museum (CSW) received consult for substance abuse during pregnancy. Per nurse infant is positive for methadone and mother is positive for marijuana. CSW met with mother and infant was at bedside. Mother was awake, alert and oriented X4 and was  laying in the bed. CSW introduced self and explained role of CSW department. Per mother she works full time at [employer] but is on maternity leave now. Per mother she lives in [place] with her boyfriend Miranda King who is the father of the infant. Per mother this is her second child and her 58 y.o daughter also lives in the home. Mother reported that she has all the baby supplies needed including a car seat. CSW made mother aware that infant is positive for methadone. Per mother she took methadone twice for a headache during her pregnancy because she believed it was a safe alternative to an opiate. CSW explained that methadone is a prescription medication that people take under the care of a medical doctor and are usually in substance abuse treatment. Patient reported that she uses marijuana on a regular basis and used it during her first pregnancy. Per patient marijuana use is normal in her family and she stated her mother used marijuana during pregnancy. CSW explained that because infant is positive for methadone then a CPS report will be made. Mother verbalized her understanding. CSW also  provided patient with outpatient substance abuse resources in [place].   CSW made a CPS report to [facility].   CSW Plan/Description:  Child Protective Service Report (CPS report made to [facility] )    Miranda King, Miranda King, [place] 08/25/2017, 3:37 PM

## 2017-08-25 NOTE — Progress Notes (Signed)
Patient states she will wait for her varicella antibody to come back and get vaccine at postpartum follow up visit if needed

## 2017-08-26 LAB — VARICELLA ZOSTER ANTIBODY, IGG: Varicella IgG: 454 index (ref >165)

## 2017-08-29 ENCOUNTER — Encounter: Payer: Medicaid Other | Admitting: Maternal Newborn

## 2017-12-08 ENCOUNTER — Emergency Department
Admission: EM | Admit: 2017-12-08 | Discharge: 2017-12-08 | Disposition: A | Discharge status: Home / Self Care | Payer: Medicaid Other | Attending: Emergency Medicine | Admitting: Emergency Medicine

## 2017-12-08 ENCOUNTER — Encounter: Payer: Self-pay | Admitting: Emergency Medicine

## 2017-12-08 DIAGNOSIS — T50901A Poisoning by unspecified drugs, medicaments and biological substances, accidental (unintentional), initial encounter: Secondary | ICD-10-CM | POA: Insufficient documentation

## 2017-12-08 DIAGNOSIS — F1910 Other psychoactive substance abuse, uncomplicated: Secondary | ICD-10-CM | POA: Diagnosis not present

## 2017-12-08 DIAGNOSIS — Z720 Tobacco use: Secondary | ICD-10-CM | POA: Diagnosis not present

## 2017-12-08 DIAGNOSIS — J45909 Unspecified asthma, uncomplicated: Secondary | ICD-10-CM | POA: Insufficient documentation

## 2017-12-08 DIAGNOSIS — R55 Syncope and collapse: Secondary | ICD-10-CM | POA: Diagnosis present

## 2017-12-08 DIAGNOSIS — T7402XA Child neglect or abandonment, confirmed, initial encounter: Secondary | ICD-10-CM

## 2017-12-08 LAB — COMPREHENSIVE METABOLIC PANEL
ALBUMIN: 4.2 g/dL (ref 3.5–5.0)
ALT: 15 U/L (ref 0–44)
ANION GAP: 8 (ref 5–15)
AST: 25 U/L (ref 15–41)
Alkaline Phosphatase: 55 U/L (ref 38–126)
BUN: 7 mg/dL (ref 6–20)
CALCIUM: 9.5 mg/dL (ref 8.9–10.3)
CO2: 29 mmol/L (ref 22–32)
Chloride: 104 mmol/L (ref 98–111)
Creatinine, Ser: 0.79 mg/dL (ref 0.44–1.00)
GFR calc Af Amer: >60 mL/min (ref >60)
GFR calc non Af Amer: >60 mL/min (ref >60)
GLUCOSE: 101 mg/dL — AB (ref 70–99)
Potassium: 4.5 mmol/L (ref 3.5–5.1)
Sodium: 141 mmol/L (ref 135–145)
TOTAL PROTEIN: 7.5 g/dL (ref 6.5–8.1)
Total Bilirubin: 0.6 mg/dL (ref 0.3–1.2)

## 2017-12-08 LAB — CBC
HEMATOCRIT: 39.4 % (ref 35.0–47.0)
Hemoglobin: 13.7 g/dL (ref 12.0–16.0)
MCH: 28.8 pg (ref 26.0–34.0)
MCHC: 34.7 g/dL (ref 32.0–36.0)
MCV: 82.9 fL (ref 80.0–100.0)
Platelets: 338 10*3/uL (ref 150–440)
RBC: 4.75 MIL/uL (ref 3.80–5.20)
RDW: 13.4 % (ref 11.5–14.5)
WBC: 7.5 10*3/uL (ref 3.6–11.0)

## 2017-12-08 LAB — URINE DRUG SCREEN, QUALITATIVE (ARMC ONLY)
Amphetamines, Ur Screen: NOT DETECTED
BARBITURATES, UR SCREEN: NOT DETECTED
CANNABINOID 50 NG, UR ~~LOC~~: NOT DETECTED
Cocaine Metabolite,Ur ~~LOC~~: POSITIVE — AB
MDMA (Ecstasy)Ur Screen: NOT DETECTED
Methadone Scn, Ur: NOT DETECTED
OPIATE, UR SCREEN: POSITIVE — AB
PHENCYCLIDINE (PCP) UR S: NOT DETECTED
Tricyclic, Ur Screen: NOT DETECTED

## 2017-12-08 LAB — SALICYLATE LEVEL: Salicylate Lvl: <7 mg/dL (ref 2.8–30.0)

## 2017-12-08 LAB — POCT PREGNANCY, URINE: Preg Test, Ur: NEGATIVE

## 2017-12-08 LAB — ACETAMINOPHEN LEVEL

## 2017-12-08 LAB — ETHANOL: Alcohol, Ethyl (B): <10 mg/dL (ref <10)

## 2017-12-08 NOTE — ED Notes (Signed)
Patient continued to refuse to have blood drawn. "I dont want to be here, why do you need my blood, can I leave, I am sitting here stressing over everything". Explained to patient why we need lab work, and her medical staff will keep her updated on medical plans and hospital care.

## 2017-12-08 NOTE — ED Provider Notes (Signed)
[facility] Emergency Department Provider Note  ____________________________________________  Time seen: Approximately 5:17 PM  I have reviewed the triage vital signs and the nursing notes.   HISTORY  Chief Complaint Loss of Consciousness and Drug Overdose  Level 5 caveat:  Portions of the history and physical were unable to be obtained due to avoiding to provide further information   HPI Miranda King is a 22 y.o. female who presents IVC by police for overdose and endangerment of her child.  Patient and boyfriend were found overdosed in a car. Patient was passed out on top of her 1856-month-old infant.  Patient reports that she spent the night at a party using cocaine and drinking alcohol.  She reports that she was not intoxicated this morning that she was just tired and fell asleep in the car.  Patient was unarousable by PD until given narcan.  Her friend was taken to jail but patient was brought to the emergency room because they had to use Narcan on her for medical clearance.  The baby is also here in police custody.  Patient's mother has patient's 24-year-old daughter and is on her way to the emergency room.  Patient denies suicidal or homicidal ideation.  She denies any medical complaints at this time.   Past Medical History:  Diagnosis Date  . Asthma   . Seizures (HCC)   . Tobacco use disorder     Patient Active Problem List   Diagnosis Date Noted  . Postpartum care following vaginal delivery 08/23/2017  . Labor and delivery, indication for care 08/22/2017  . Substance abuse affecting pregnancy, antepartum 07/25/2017  . Opioid use 07/24/2017  . Supervision of high risk pregnancy, antepartum, third trimester 05/27/2017  . Limited prenatal care in second trimester 05/27/2017  . History of tattoo 05/23/2015  . Seizure disorder (HCC) 10/04/2014  . Asthma 09/30/2014  . Tobacco use disorder 09/30/2014    Past Surgical History:  Procedure  Laterality Date  . DILATION AND EVACUATION N/A 10/03/2015   Procedure: DILATATION AND EVACUATION;  Surgeon: Nadara Mustardobert P Harris, MD;  Location: [facility] ORS;  Service: Gynecology;  Laterality: N/A;  . VAGINAL DELIVERY      Prior to Admission medications   Medication Sig Start Date End Date Taking? Authorizing Provider  Prenatal Multivit-Min-Fe-FA (PRENATAL VITAMINS) 0.8 MG tablet Take 1 tablet by mouth daily. 05/27/17   Natale MilchSchuman, Christanna R, MD    Allergies Tramadol hcl  Family History  Problem Relation Age of Onset  . Migraines Mother   . Asthma Sister   . Hypertension Maternal Grandmother   . Diabetes Maternal Grandmother     Social History Social History   Tobacco Use  . Smoking status: Current Some Day Smoker  . Smokeless tobacco: Never Used  . Tobacco comment: Had a cigarette prior to arriving at the hospital.   Substance Use Topics  . Alcohol use: No    Alcohol/week: 0.0 standard drinks  . Drug use: No    Review of Systems  Constitutional: Negative for fever. + overdose Eyes: Negative for visual changes. ENT: Negative for sore throat. Neck: No neck pain  Cardiovascular: Negative for chest pain. Respiratory: Negative for shortness of breath. Gastrointestinal: Negative for abdominal pain, vomiting or diarrhea. Genitourinary: Negative for dysuria. Musculoskeletal: Negative for back pain. Skin: Negative for rash. Neurological: Negative for headaches, weakness or numbness. Psych: No SI or HI  ____________________________________________   PHYSICAL EXAM:  VITAL SIGNS: ED Triage Vitals  Enc Vitals Group  BP 12/08/17 1608 101/64     Pulse Rate 12/08/17 1608 71     Resp 12/08/17 1608 18     Temp 12/08/17 1608 98.4 F (36.9 C)     Temp Source 12/08/17 1608 Oral     SpO2 12/08/17 1608 97 %     Weight 12/08/17 1614 130 lb (59 kg)     Height 12/08/17 1614 5\' 6"  (1.676 m)     Head Circumference --      Peak Flow --      Pain Score 12/08/17 1612 0     Pain Loc  --      Pain Edu? --      Excl. in GC? --     Constitutional: Alert and oriented. Well appearing and in no apparent distress. HEENT:      Head: Normocephalic and atraumatic.         Eyes: Conjunctivae are normal. Sclera is non-icteric.       Mouth/Throat: Mucous membranes are moist.       Neck: Supple with no signs of meningismus. Cardiovascular: Regular rate and rhythm. No murmurs, gallops, or rubs. 2+ symmetrical distal pulses are present in all extremities. No JVD. Respiratory: Normal respiratory effort. Lungs are clear to auscultation bilaterally. No wheezes, crackles, or rhonchi.  Gastrointestinal: Soft, non tender, and non distended with positive bowel sounds. No rebound or guarding. Musculoskeletal: Nontender with normal range of motion in all extremities. No edema, cyanosis, or erythema of extremities. Neurologic: Normal speech and language. Face is symmetric. Moving all extremities. No gross focal neurologic deficits are appreciated. Skin: Skin is warm, dry and intact. No rash noted. Psychiatric: Mood and affect are normal. Speech and behavior are normal.  ____________________________________________   LABS (all labs ordered are listed, but only abnormal results are displayed)  Labs Reviewed  COMPREHENSIVE METABOLIC PANEL - Abnormal; Notable for the following components:      Result Value   Glucose, Bld 101 (*)    All other components within normal limits  ACETAMINOPHEN LEVEL - Abnormal; Notable for the following components:   Acetaminophen (Tylenol), Serum <10 (*)    All other components within normal limits  URINE DRUG SCREEN, QUALITATIVE ([facility] ONLY) - Abnormal; Notable for the following components:   Cocaine Metabolite,Ur [place] POSITIVE (*)    Opiate, Ur Screen POSITIVE (*)    Benzodiazepine, Ur Scrn TEST NOT PERFORMED, REAGENT NOT AVAILABLE (*)    All other components within normal limits  ETHANOL  SALICYLATE LEVEL  CBC  POC URINE PREG, ED  POCT PREGNANCY, URINE    CBG MONITORING, ED   ____________________________________________  EKG  ED ECG REPORT I, Nita Sickle, the attending physician, personally viewed and interpreted this ECG.  Normal sinus rhythm, rate of 67, normal intervals, normal axis, no ST elevations or depressions. ____________________________________________  RADIOLOGY  none  ____________________________________________   PROCEDURES  Procedure(s) performed: None Procedures Critical Care performed:  None ____________________________________________   INITIAL IMPRESSION / ASSESSMENT AND PLAN / ED COURSE   22 y.o. female who presents IVC by police for overdose and endangerment of her child.  Patient received Narcan per police, she arrives a GCS of 15, alert and oriented x3, stable vital signs and nonfocal physical exam.  Baby is in police custody as well being evaluated by Dr. Alphonzo Lemmings.  Patient's 2-year-old daughter is with patient's mother and safe at this time.  Child protective service has been contacted.  At this time there are no signs of respiratory depression. No SI  or HI.   Clinical Course as of Dec 09 2223  Mon Dec 08, 2017  2223 Patient evaluated by tele-psychiatry Dr. Kathie Dike who lifted IVC and recommended discharge home.  Patient's baby is with her mother and safe at this time.  Child protective services and police are involved in the case.   [CV]    Clinical Course User Index [CV] Don Perking [place], MD     As part of my medical decision making, I reviewed the following data within the electronic MEDICAL RECORD NUMBER Nursing notes reviewed and incorporated, Labs reviewed , EKG interpreted , A consult was requested and obtained from this/these consultant(s) Psychiatry, Notes from prior ED visits and [place] Controlled Substance Database    Pertinent labs & imaging results that were available during my care of the patient were reviewed by me and considered in my medical decision making (see chart for  details).    ____________________________________________   FINAL CLINICAL IMPRESSION(S) / ED DIAGNOSES  Final diagnoses:  Accidental drug overdose, initial encounter  Child neglect, initial encounter      NEW MEDICATIONS STARTED DURING THIS VISIT:  ED Discharge Orders    None       Note:  This document was prepared using Dragon voice recognition software and may include unintentional dictation errors.    Don Perking, [place], MD 12/08/17 2225

## 2017-12-08 NOTE — ED Notes (Signed)
Pt dressed out into appropriate behavioral health clothing with this tech, Georgeanna LeaJadeka,RN and MD Don PerkingVeronese in the rm. Pt belongings consist of a tan shirt, a black sports bra, black pants with pink flowers, black shoes, a blue hairbow and a black/gray bag with diapers, baby formula, baby wipes and a baby bottle. Pt cooperative while dressing out. Pt belongings placed in two pt belongings bags and labeled with pt name.

## 2017-12-08 NOTE — ED Notes (Signed)
Dr Jacky KindlePenalver given report for telepsych

## 2017-12-08 NOTE — ED Notes (Signed)
Patient refusing to have blood drawn in triage.  This RN explained IVC to patient.  Patient also refusing to give urine sample.  Patient states, "you think you can do what the fuck you want, even though I am walking and talking.  It's my body."

## 2017-12-08 NOTE — ED Triage Notes (Signed)
[facility].  [employer] was called out to patient's car due to patient being passed out on top of her 263 month old infant. Patient was unarousable and officers administered narcan.  Patient and her boyfriend were both passed out in vehicle with infant son. Patient states she was partying last night using alcohol and drugs.  Patient reports using cocaine and drinking alcohol.  Patient is demanding to see her son.  Patient states she was passed out because she was tired and states she is stressed due to housing situation.  States she is living with her boyfriend's family.

## 2017-12-08 NOTE — ED Notes (Signed)
Patient is being visited by CPS

## 2018-06-24 ENCOUNTER — Encounter: Payer: Self-pay | Admitting: Obstetrics and Gynecology

## 2018-06-24 ENCOUNTER — Ambulatory Visit (INDEPENDENT_AMBULATORY_CARE_PROVIDER_SITE_OTHER): Payer: Medicaid Other | Admitting: Obstetrics and Gynecology

## 2018-06-24 ENCOUNTER — Telehealth: Payer: Self-pay | Admitting: Obstetrics and Gynecology

## 2018-06-24 ENCOUNTER — Other Ambulatory Visit: Payer: Self-pay

## 2018-06-24 VITALS — BP 100/50 | HR 76 | Ht 66.0 in | Wt 133.0 lb

## 2018-06-24 DIAGNOSIS — F1990 Other psychoactive substance use, unspecified, uncomplicated: Secondary | ICD-10-CM

## 2018-06-24 DIAGNOSIS — Z113 Encounter for screening for infections with a predominantly sexual mode of transmission: Secondary | ICD-10-CM

## 2018-06-24 DIAGNOSIS — Z3490 Encounter for supervision of normal pregnancy, unspecified, unspecified trimester: Secondary | ICD-10-CM

## 2018-06-24 DIAGNOSIS — O3680X0 Pregnancy with inconclusive fetal viability, not applicable or unspecified: Secondary | ICD-10-CM

## 2018-06-24 NOTE — Progress Notes (Signed)
Patient ID: Miranda King, female   DOB: 22-Aug-1995, 23 y.o.   MRN: [id number]  Reason for Consult: Exposure to STD (STD testing, White sticky discharge, poss pregnancy test at PCP)   Referred by Gabriel Cirri, NP  Subjective:     HPI:  Miranda King is a 23 y.o. female. She presents today after seen by her PCP. Her PCP diagnosed her pregnancy at a recent visit. By her LMP she is  She is planning to terminate the pregnancy. She has an appointment on March 16th at [facility].  She would like to have a Nexplanon placed after her termination.  She has had 3 years of ASCUS HPV+ pap smears and needs a colposcopy.  She would like to be screened today for STI.    Past Medical History:  Diagnosis Date  . Asthma   . Seizures (HCC)   . Tobacco use disorder    Family History  Problem Relation Age of Onset  . Migraines Mother   . Asthma Sister   . Hypertension Maternal Grandmother   . Diabetes Maternal Grandmother    Past Surgical History:  Procedure Laterality Date  . DILATION AND EVACUATION N/A 10/03/2015   Procedure: DILATATION AND EVACUATION;  Surgeon: Nadara Mustard, MD;  Location: [facility] ORS;  Service: Gynecology;  Laterality: N/A;  . VAGINAL DELIVERY      Short Social History:  Social History   Tobacco Use  . Smoking status: Current Some Day Smoker  . Smokeless tobacco: Never Used  . Tobacco comment: Had a cigarette prior to arriving at the hospital.   Substance Use Topics  . Alcohol use: No    Alcohol/week: 0.0 standard drinks    Allergies  Allergen Reactions  . Tramadol Hcl Nausea And Vomiting    Current Outpatient Medications  Medication Sig Dispense Refill  . SUBOXONE 8-2 MG FILM     . Prenatal Multivit-Min-Fe-FA (PRENATAL VITAMINS) 0.8 MG tablet Take 1 tablet by mouth daily. (Patient not taking: Reported on 06/24/2018) 30 tablet 12   No current facility-administered medications for this visit.     Review of Systems   Constitutional: Negative for chills, fatigue, fever and unexpected weight change.  HENT: Negative for trouble swallowing.  Eyes: Negative for loss of vision.  Respiratory: Negative for cough, shortness of breath and wheezing.  Cardiovascular: Negative for chest pain, leg swelling, palpitations and syncope.  GI: Negative for abdominal pain, blood in stool, diarrhea, nausea and vomiting.  GU: Negative for difficulty urinating, dysuria, frequency and hematuria.  Musculoskeletal: Negative for back pain, leg pain and joint pain.  Skin: Negative for rash.  Neurological: Negative for dizziness, headaches, light-headedness, numbness and seizures.  Psychiatric: Negative for behavioral problem, confusion, depressed mood and sleep disturbance.        Objective:  Objective   Vitals:   06/24/18 0948  BP: (!) 100/50  Pulse: 76  Weight: 133 lb (60.3 kg)  Height: 5\' 6"  (1.676 m)   Body mass index is 21.47 kg/m.  Physical Exam Vitals signs and nursing note reviewed.  Constitutional:      Appearance: She is well-developed.  HENT:     Head: Normocephalic and atraumatic.  Eyes:     Pupils: Pupils are equal, round, and reactive to light.  Cardiovascular:     Rate and Rhythm: Normal rate and regular rhythm.  Pulmonary:     Effort: Pulmonary effort is normal. No respiratory distress.  Abdominal:     General: Abdomen is flat.  There is no distension.     Palpations: Abdomen is soft. There is no mass.     Tenderness: There is no abdominal tenderness. There is no guarding.     Hernia: No hernia is present.  Genitourinary:    General: Normal vulva.     Comments: Cervix grossly normal Scan white discharge No CMT Not uterine or adnexal tenderness Normal uterus No vaginal bleeding Skin:    General: Skin is warm and dry.  Neurological:     Mental Status: She is alert and oriented to person, place, and time.  Psychiatric:        Behavior: Behavior normal.        Thought Content: Thought  content normal.        Judgment: Judgment normal.    Gestational Sac seen on bedside transvaginal US No yolk sac, no IUP US performed with chaperone present GS diameter:2.03 x 1.46 x 1.69 cm  Mean Sac Diameter: 1.73cm  gives EDD of 02/16/2019 Photo placed in media.      Assessment/Plan:    23 yo G4P2012 at 6 wk 1 day by bedside transvaginal US Patient has an appointment on the 16th for a medical termination. If she does not terminate will need an [place] in 2 weeks for viability.  She would like to have a Nexplanon placed after this procedure- will schedule for 07/03/2018 She needs a colposcopy , this is her 3rd year of ASCUS HPV+ pap smears (2017, 2019, 2020), will schedule for 07/10/2018.  More than 25 minutes were spent face to face with the patient in the room with more than 50% of the time spent providing counseling and discussing the plan of management.   Adelene Idler MD [facility], [facility] 06/24/2018 10:41 AM

## 2018-06-24 NOTE — Telephone Encounter (Signed)
Patient scheduled 3/20 with [facility] for nexplanon

## 2018-06-25 LAB — MONITOR DRUG PROFILE 14(MW)
Amphetamine Scrn, Ur: NEGATIVE ng/mL
BARBITURATE SCREEN URINE: NEGATIVE ng/mL
BENZODIAZEPINE SCREEN, URINE: NEGATIVE ng/mL
BUPRENORPHINE, URINE: NEGATIVE ng/mL
CANNABINOIDS UR QL SCN: NEGATIVE ng/mL
CREATININE(CRT), U: 94 mg/dL (ref 20.0–300.0)
Cocaine (Metab) Scrn, Ur: NEGATIVE ng/mL
Fentanyl, Urine: NEGATIVE pg/mL
METHADONE SCREEN, URINE: NEGATIVE ng/mL
Meperidine Screen, Urine: NEGATIVE ng/mL
OPIATE SCREEN URINE: NEGATIVE ng/mL
OXYCODONE+OXYMORPHONE UR QL SCN: NEGATIVE ng/mL
PHENCYCLIDINE QUANTITATIVE URINE: NEGATIVE ng/mL
Ph of Urine: 6.7 (ref 4.5–8.9)
Propoxyphene Scrn, Ur: NEGATIVE ng/mL
SPECIFIC GRAVITY: 1.011
TRAMADOL SCREEN, URINE: NEGATIVE ng/mL

## 2018-06-27 LAB — NUSWAB VAGINITIS PLUS (VG+)
Atopobium vaginae: HIGH Score — AB
BVAB 2: HIGH Score — AB
CANDIDA ALBICANS, NAA: NEGATIVE
CHLAMYDIA TRACHOMATIS, NAA: NEGATIVE
Candida glabrata, NAA: NEGATIVE
Megasphaera 1: HIGH Score — AB
NEISSERIA GONORRHOEAE, NAA: NEGATIVE
TRICH VAG BY NAA: NEGATIVE

## 2018-06-29 ENCOUNTER — Other Ambulatory Visit: Payer: Self-pay | Admitting: Obstetrics and Gynecology

## 2018-06-29 DIAGNOSIS — N760 Acute vaginitis: Principal | ICD-10-CM

## 2018-06-29 DIAGNOSIS — B9689 Other specified bacterial agents as the cause of diseases classified elsewhere: Secondary | ICD-10-CM

## 2018-06-29 MED ORDER — METRONIDAZOLE 500 MG PO TABS: 500.0000 mg | ORAL_TABLET | Freq: Two times a day (BID) | ORAL | 0 refills | Status: AC

## 2018-06-29 NOTE — Progress Notes (Signed)
Called 06/29/2018, no answer and voicemail was full- sent in rx, message sent to [facility]

## 2018-07-03 ENCOUNTER — Ambulatory Visit: Payer: Medicaid Other | Admitting: Obstetrics and Gynecology

## 2018-07-10 ENCOUNTER — Ambulatory Visit: Payer: Medicaid Other | Admitting: Obstetrics and Gynecology

## 2018-07-29 NOTE — Telephone Encounter (Signed)
Patient no show appointment. ? ?

## 2019-02-24 ENCOUNTER — Other Ambulatory Visit: Payer: Self-pay | Admitting: Neurology

## 2019-02-24 DIAGNOSIS — R569 Unspecified convulsions: Secondary | ICD-10-CM

## 2019-03-02 DIAGNOSIS — G43119 Migraine with aura, intractable, without status migrainosus: Secondary | ICD-10-CM | POA: Insufficient documentation

## 2019-03-07 ENCOUNTER — Ambulatory Visit: Admission: RE | Admit: 2019-03-07 | Payer: Medicaid Other | Source: Ambulatory Visit

## 2020-10-09 ENCOUNTER — Ambulatory Visit
Admission: EM | Admit: 2020-10-09 | Discharge: 2020-10-09 | Disposition: A | Discharge status: Home / Self Care | Payer: Medicaid Other

## 2020-10-09 ENCOUNTER — Other Ambulatory Visit: Payer: Self-pay

## 2020-10-09 ENCOUNTER — Ambulatory Visit (INDEPENDENT_AMBULATORY_CARE_PROVIDER_SITE_OTHER): Payer: Medicaid Other

## 2020-10-09 DIAGNOSIS — S6000XA Contusion of unspecified finger without damage to nail, initial encounter: Secondary | ICD-10-CM | POA: Diagnosis not present

## 2020-10-09 DIAGNOSIS — W2209XA Striking against other stationary object, initial encounter: Secondary | ICD-10-CM

## 2020-10-09 DIAGNOSIS — M79641 Pain in right hand: Secondary | ICD-10-CM

## 2020-10-09 DIAGNOSIS — S60221A Contusion of right hand, initial encounter: Secondary | ICD-10-CM

## 2020-10-09 NOTE — ED Provider Notes (Signed)
[facility]    CSN: [id number] Arrival date & time: 10/09/20  1124      History   Chief Complaint No chief complaint on file.   HPI Miranda King is a 25 y.o. female.   HPI  11 old female here for evaluation of right hand pain.  Patient reports that she was helping her mom move 2 weeks ago and hit her right hand against the door jam.  She states that she does not member being really painful at a time and it did not swell until later.  She states over the last 2 weeks she has had continued swelling and pain.  She states that sometimes the pain is a burning sensation that runs from her wrist to the tip of her fifth finger.  Otherwise she has full range of motion and sensation in all the fingers of her right hand.  Past Medical History:  Diagnosis Date   Asthma    Seizures (HCC)    Tobacco use disorder     Patient Active Problem List   Diagnosis Date Noted   Postpartum care following vaginal delivery 08/23/2017   Labor and delivery, indication for care 08/22/2017   Substance abuse affecting pregnancy, antepartum 07/25/2017   Opioid use 07/24/2017   Supervision of high risk pregnancy, antepartum, third trimester 05/27/2017   Limited prenatal care in second trimester 05/27/2017   History of tattoo 05/23/2015   Seizure disorder (HCC) 10/04/2014   Asthma 09/30/2014   Tobacco use disorder 09/30/2014    Past Surgical History:  Procedure Laterality Date   DILATION AND EVACUATION N/A 10/03/2015   Procedure: DILATATION AND EVACUATION;  Surgeon: Nadara Mustard, MD;  Location: [facility] ORS;  Service: Gynecology;  Laterality: N/A;   VAGINAL DELIVERY      OB History     Gravida  3   Para  2   Term  2   Preterm      AB  1   Living  2      SAB      IAB  1   Ectopic      Multiple  0   Live Births  2            Home Medications    Prior to Admission medications   Medication Sig Start Date End Date Taking? Authorizing Provider  albuterol  (VENTOLIN HFA) 108 (90 Base) MCG/ACT inhaler Inhale into the lungs. 11/06/19  Yes [provider]  Prenatal Multivit-Min-Fe-FA (PRENATAL VITAMINS) 0.8 MG tablet Take 1 tablet by mouth daily. Patient not taking: No sig reported 05/27/17   Natale Milch, MD  SUBOXONE 8-2 MG FILM  06/18/18   [provider]    Family History Family History  Problem Relation Age of Onset   Migraines Mother    Asthma Sister    Hypertension Maternal Grandmother    Diabetes Maternal Grandmother     Social History Social History   Tobacco Use   Smoking status: Some Days    Pack years: 0.00   Smokeless tobacco: Never   Tobacco comments:    Had a cigarette prior to arriving at the hospital.   Vaping Use   Vaping Use: Never used  Substance Use Topics   Alcohol use: No    Alcohol/week: 0.0 standard drinks   Drug use: No     Allergies   Tramadol hcl   Review of Systems Review of Systems  Musculoskeletal:  Positive for arthralgias and joint swelling.  Negative for myalgias.  Skin:  Negative for color change and wound.  Neurological:  Negative for weakness and numbness.    Physical Exam Triage Vital Signs ED Triage Vitals  Enc Vitals Group     BP 10/09/20 1304 111/78     Pulse Rate 10/09/20 1304 67     Resp 10/09/20 1304 16     Temp 10/09/20 1304 99 F (37.2 C)     Temp Source 10/09/20 1304 Oral     SpO2 10/09/20 1304 100 %     Weight --      Height --      Head Circumference --      Peak Flow --      Pain Score 10/09/20 1307 7     Pain Loc --      Pain Edu? --      Excl. in GC? --    No data found.  Updated Vital Signs BP 111/78 (BP Location: Left Arm)   Pulse 67   Temp 99 F (37.2 C) (Oral)   Resp 16   LMP 10/07/2020   SpO2 100%   Visual Acuity Right Eye Distance:   Left Eye Distance:   Bilateral Distance:    Right Eye Near:   Left Eye Near:    Bilateral Near:     Physical Exam Vitals reviewed.  Constitutional:      General: She is not in  acute distress.    Appearance: Normal appearance. She is normal weight. She is not ill-appearing.  HENT:     Head: Normocephalic and atraumatic.  Musculoskeletal:        General: Swelling and tenderness present. No deformity.  Skin:    General: Skin is warm and dry.     Capillary Refill: Capillary refill takes less than 2 seconds.     Findings: No bruising or erythema.  Neurological:     General: No focal deficit present.     Mental Status: She is alert.  Psychiatric:        Mood and Affect: Mood normal.        Behavior: Behavior normal.        Thought Content: Thought content normal.        Judgment: Judgment normal.     UC Treatments / Results  Labs (all labs ordered are listed, but only abnormal results are displayed) Labs Reviewed - No data to display  EKG   Radiology No results found.  Procedures Procedures (including critical care time)  Medications Ordered in UC Medications - No data to display  Initial Impression / Assessment and Plan / UC Course  I have reviewed the triage vital signs and the nursing notes.  Pertinent labs & imaging results that were available during my care of the patient were reviewed by me and considered in my medical decision making (see chart for details).  Patient is a very pleasant 25 year old female here for evaluation of pain in her right fifth metacarpal.  She states that is been present for the past 2 weeks.  The pain is occasionally a burning pain that travels from her wrist down the outside of her right fifth finger.  She has full range of motion, sensation, and her grip is 4/5 in her right hand.  Patient does not have any tenderness with palpation of the phalanges but she does have tenderness with palpation of the fifth MCP joint as well as the distal metacarpal.  No tenderness of the proximal metacarpal, carpal bones, radial  ulnar styloid.  We will obtain radiograph of right hand to evaluate for possible fracture.  Radiographs of  right hand independently reviewed and evaluated by me.  To patient: No evidence of fracture or dislocation of any of the phalanges of the fifth finger or of the fifth metacarpal.   Final Clinical Impressions(s) / UC Diagnoses   Final diagnoses:  Contusion of right hand including fingers, initial encounter     Discharge Instructions      Continue use over-the-counter Tylenol and ibuprofen as needed for pain.    You can also apply moist heat to the area to help increase blood flow.  You can also buddy tape your ring finger and your little finger together to give added support and which might also help you with pain relief.  You may have also bruised the nerve which is causing the burning sensation and that will heal with time.     ED Prescriptions   None    PDMP not reviewed this encounter.   Becky Augusta, NP 10/09/20 1400

## 2020-10-09 NOTE — ED Triage Notes (Signed)
Patient presents to Urgent Care with complaints of left pink finger pain since 2 weeks ago. She states she hit her hand against wall. Pt reports pain is a bunring sensation. Treating pain with advil.

## 2020-10-09 NOTE — Discharge Instructions (Addendum)
Continue use over-the-counter Tylenol and ibuprofen as needed for pain.    You can also apply moist heat to the area to help increase blood flow.  You can also buddy tape your ring finger and your little finger together to give added support and which might also help you with pain relief.  You may have also bruised the nerve which is causing the burning sensation and that will heal with time.

## 2021-09-08 IMAGING — CR DG HAND COMPLETE 3+V*R*
3 series · 3 of 3 positions shown · non-contrast
Comparison: None.

CLINICAL DATA: Pain after hitting hand against wall

EXAM:
RIGHT HAND - COMPLETE 3+ VIEW

[hand ap]
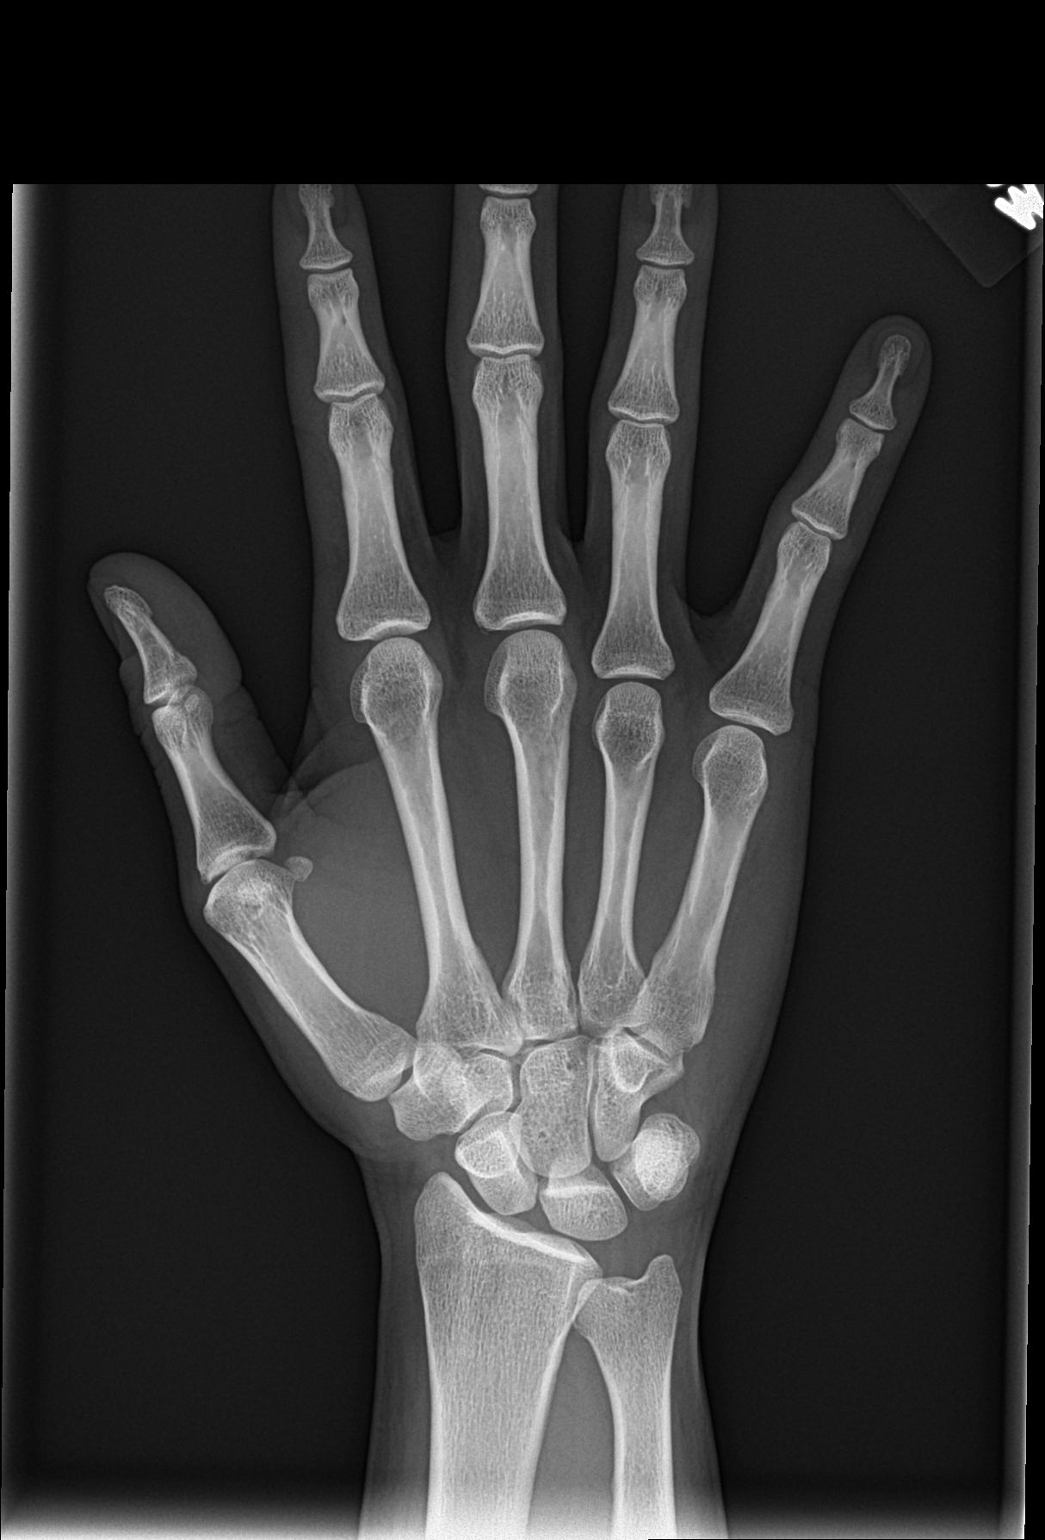

[hand obl]
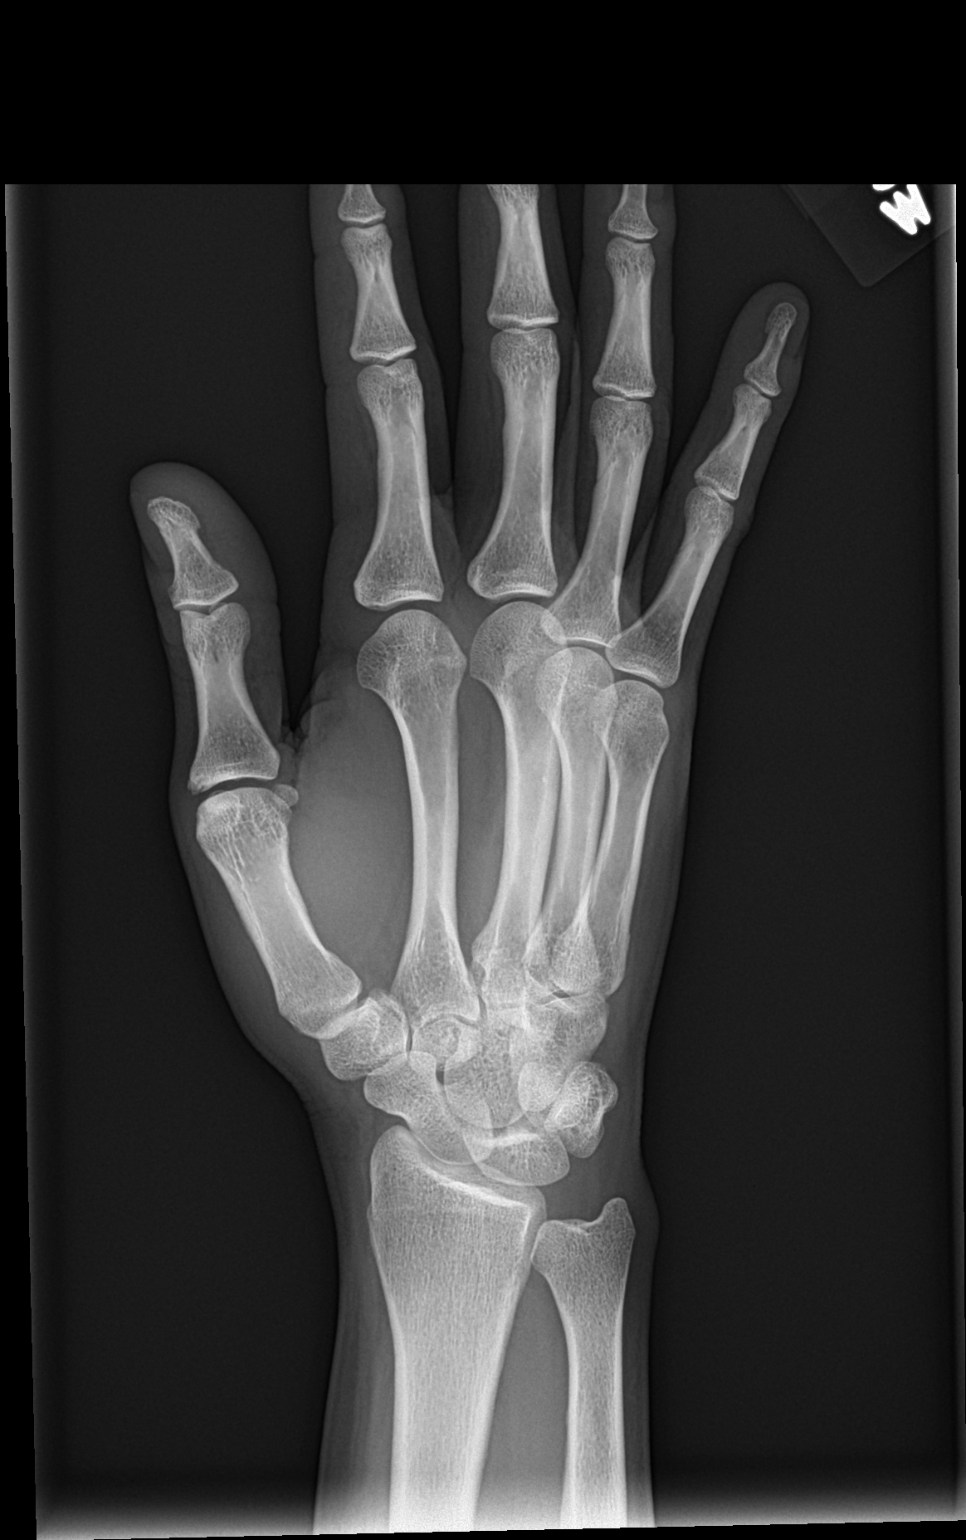

[hand lat]
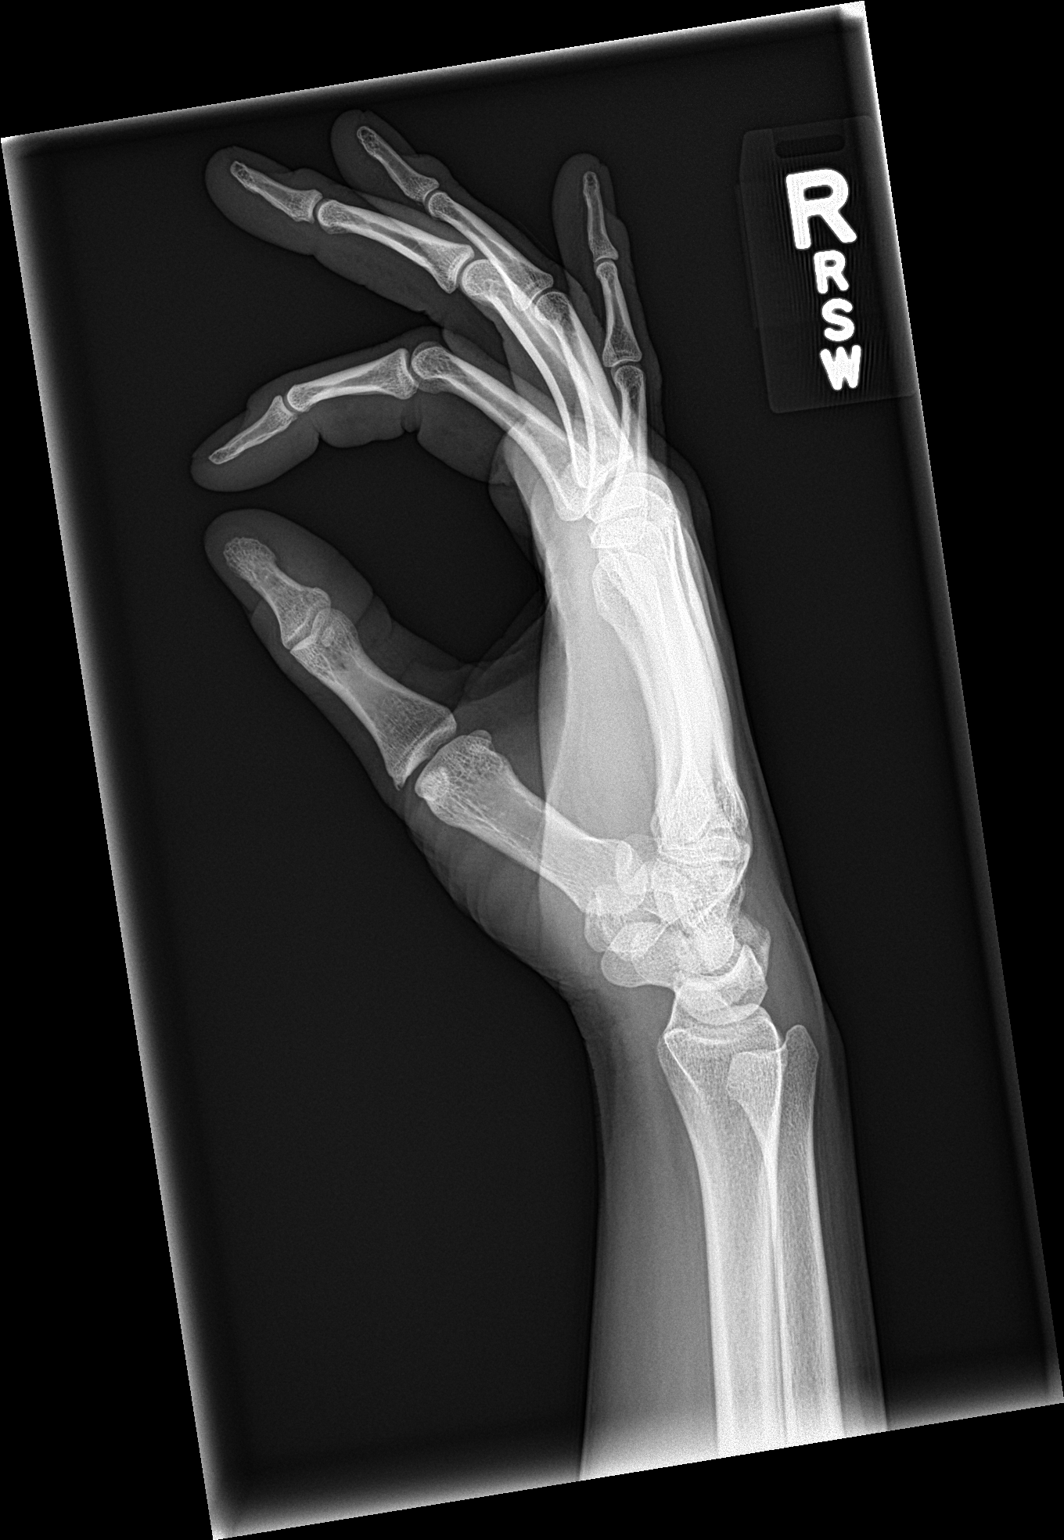

[3 of 3 positions shown; findings below may reference images not displayed]

FINDINGS: Frontal, oblique, and lateral views obtained. No fracture or
dislocation. Joint spaces appear normal. No erosive change.
Incidental note is made of a minus ulnar variance.
IMPRESSION: No fracture or dislocation.  No evident arthropathic change.

## 2022-01-07 ENCOUNTER — Ambulatory Visit
Admission: EM | Admit: 2022-01-07 | Discharge: 2022-01-07 | Disposition: A | Discharge status: Home / Self Care | Payer: Medicaid Other

## 2022-01-07 DIAGNOSIS — M545 Low back pain, unspecified: Secondary | ICD-10-CM | POA: Diagnosis not present

## 2022-01-07 MED ORDER — PREDNISONE 10 MG PO TABS: 40.0000 mg | ORAL_TABLET | Freq: Every day | ORAL | 0 refills | Status: AC

## 2022-01-07 NOTE — ED Triage Notes (Signed)
Pt. States that she has back pain that started today. Pt. Has taken advil w/ no relief.

## 2022-01-07 NOTE — Discharge Instructions (Addendum)
Follow-up with your primary care provider if symptoms do not resolve with treatment or recur.

## 2022-01-07 NOTE — ED Provider Notes (Signed)
[facility]    CSN: [id number] Arrival date & time: 01/07/22  1802      History   Chief Complaint Chief Complaint  Patient presents with   Back Pain    HPI Miranda King is a 26 y.o. female.    Back Pain   Patient presents to urgent care with complaint of back pain that started today.  Patient says she was changing the sheets on a family member's bed and bent over and had sudden shooting pain in her back.  Pain is lower back left right and middle.  Denies sciatica.  No saddle paresthesias.  No loss of bowel or bladder.  Denies any past injury or precipitating event.  No history of back pain.  Describes the pain as what she experienced during labor prior to childbirth.    Pain radiates to her front.  Denies dysuria or suprapubic pain.  Denies frequency, urgency, foul-smelling urine.  Endorses pain with bending forward.  No pain leaning back.  Avoids sitting and laying down because of the pain and getting up.  Past Medical History:  Diagnosis Date   Asthma    Seizures ([place])    Tobacco use disorder     Patient Active Problem List   Diagnosis Date Noted   Intractable migraine with aura without status migrainosus 03/02/2019   Postpartum care following vaginal delivery 08/23/2017   Labor and delivery, indication for care 08/22/2017   Substance abuse affecting pregnancy, antepartum 07/25/2017   Opioid use 07/24/2017   Supervision of high risk pregnancy, antepartum, third trimester 05/27/2017   Limited prenatal care in second trimester 05/27/2017   History of tattoo 05/23/2015   Routine health maintenance 05/23/2015   Seizure disorder ([place]) 10/04/2014   Asthma 09/30/2014   Tobacco use disorder 09/30/2014   Focal seizure ([place]) 09/30/2014   Dizziness 04/21/2013   Fatigue 04/21/2013   Left foot pain 04/21/2013    Past Surgical History:  Procedure Laterality Date   DILATION AND EVACUATION N/A 10/03/2015   Procedure: DILATATION AND EVACUATION;  Surgeon:  Gae Dry, MD;  Location: [facility] ORS;  Service: Gynecology;  Laterality: N/A;   VAGINAL DELIVERY      OB History     Gravida  3   Para  2   Term  2   Preterm      AB  1   Living  2      SAB      IAB  1   Ectopic      Multiple  0   Live Births  2            Home Medications    Prior to Admission medications   Medication Sig Start Date End Date Taking? Authorizing Provider  cyanocobalamin (VITAMIN B12) 1000 MCG/ML injection Inject 22ml once a week for four weeks then once a month for four months. 03/02/19  Yes [provider]  lamoTRIgine (LAMICTAL) 25 MG tablet Week 1: 25 mg daily,Week 2: 25 mg two times a day,Week 3: 25 mg in the am, 50 mg in the pm,Week 4: 50 mg two times a day,Week 5: 50 mg in the am, 75 mg in the pm,Week 6: 75 mg two times a day, Week 7: 75 mg in the am, 100 mg in the pm,Week 8: 100 mg two times a day 02/16/19  Yes [provider]  albuterol (VENTOLIN HFA) 108 (90 Base) MCG/ACT inhaler Inhale into the lungs. 11/06/19   [provider]  Prenatal Multivit-Min-Fe-FA (PRENATAL VITAMINS) 0.8 MG tablet Take 1 tablet by mouth daily. Patient not taking: No sig reported 05/27/17   Natale Milch, MD  SUBOXONE 8-2 MG FILM  06/18/18   [provider]    Family History Family History  Problem Relation Age of Onset   Migraines Mother    Asthma Sister    Hypertension Maternal Grandmother    Diabetes Maternal Grandmother     Social History Social History   Tobacco Use   Smoking status: Some Days   Smokeless tobacco: Never   Tobacco comments:    Had a cigarette prior to arriving at the hospital.   Vaping Use   Vaping Use: Never used  Substance Use Topics   Alcohol use: No    Alcohol/week: 0.0 standard drinks of alcohol   Drug use: No     Allergies   Tramadol hcl   Review of Systems Review of Systems  Musculoskeletal:  Positive for back pain.     Physical Exam Triage Vital Signs ED Triage  Vitals [01/07/22 1824]  Enc Vitals Group     BP 112/77     Pulse Rate 82     Resp 18     Temp 97.7 F (36.5 C)     Temp src      SpO2 96 %     Weight      Height      Head Circumference      Peak Flow      Pain Score 10     Pain Loc      Pain Edu?      Excl. in GC?    No data found.  Updated Vital Signs BP 112/77   Pulse 82   Temp 97.7 F (36.5 C)   Resp 18   LMP 12/31/2021 (Approximate)   SpO2 96%   Visual Acuity Right Eye Distance:   Left Eye Distance:   Bilateral Distance:    Right Eye Near:   Left Eye Near:    Bilateral Near:     Physical Exam Vitals reviewed.  Constitutional:      Appearance: Normal appearance.  Musculoskeletal:     Lumbar back: No tenderness or bony tenderness. Decreased range of motion. Positive right straight leg raise test and positive left straight leg raise test.  Skin:    General: Skin is warm and dry.  Neurological:     General: No focal deficit present.     Mental Status: She is alert and oriented to person, place, and time.  Psychiatric:        Mood and Affect: Mood normal.        Behavior: Behavior normal.      UC Treatments / Results  Labs (all labs ordered are listed, but only abnormal results are displayed) Labs Reviewed - No data to display  EKG   Radiology No results found.  Procedures Procedures (including critical care time)  Medications Ordered in UC Medications - No data to display  Initial Impression / Assessment and Plan / UC Course  I have reviewed the triage vital signs and the nursing notes.  Pertinent labs & imaging results that were available during my care of the patient were reviewed by me and considered in my medical decision making (see chart for details).   Acute bilateral lower back pain versus muscle spasm.  Will treat with short course of prednisone to try to resolve inflammation that might be causing her pain.  Asked her  not to use NSAID medication while taking prednisone.   Follow-up with your primary care provider if symptoms do not resolve with treatment or recur.   Final Clinical Impressions(s) / UC Diagnoses   Final diagnoses:  None   Discharge Instructions   None    ED Prescriptions   None    PDMP not reviewed this encounter.   Charma Igo, [place] 01/07/22 1900

## 2022-04-15 NOTE — L&D Delivery Note (Signed)
Delivery Note   Miranda King is a 27 y.o. [id number] at [redacted]w[redacted]d Estimated Date of Delivery: 04/18/23  PRE-OPERATIVE DIAGNOSIS:  1) [redacted]w[redacted]d pregnancy.    POST-OPERATIVE DIAGNOSIS:  1) [redacted]w[redacted]d pregnancy s/p NSVD  Delivery Type: NSVD  Delivery Anesthesia: Epidural  Labor Complications: none    ESTIMATED BLOOD LOSS: 150 ml    FINDINGS:   1) female infant, "Monna Fam, "Apgar scores of 10   at 1 minute and 10   at 5 minutes and a birthweight of   ounces.     SPECIMENS:   PLACENTA:   Appearance: Intact   Removal: Spontaneous     Disposition: Per protocol  CORD BLOOD: Collected DISPOSITION:  Infant left in stable condition in the delivery room, with L&D personnel and mother,  NARRATIVE SUMMARY: Labor course:  [name] is a [id number] at [redacted]w[redacted]d who presented to Labor & Delivery for labor. Her initial cervical exam was 5/70/-2. Labor proceeded spontaneously, and she was found to be completely dilated at 2020. With excellent maternal pushing effort, she gently birthed a viable female infant at 2043. There was not a nuchal cord. The shoulders were birthed without difficulty. The infant was placed skin-to-skin with [name]. The cord was doubly clamped and cut by Jenniferann when pulsations ceased. The placenta delivered spontaneously and was noted to be intact with a 3VC.Trailing membranes were removed with ring forceps. A perineal and vaginal examination was performed. Episiotomy/Lacerations: small hemostatic right labial laceration, not repaired  [name] tolerated this well. Mother and baby were left in stable condition.   [name], CNM 04/11/2023 9:02 PM

## 2022-07-13 ENCOUNTER — Emergency Department: Payer: Medicaid Other | Admitting: Registered Nurse

## 2022-07-13 ENCOUNTER — Observation Stay
Admission: EM | Admit: 2022-07-13 | Discharge: 2022-07-14 | Disposition: A | Discharge status: Home / Self Care | Payer: Medicaid Other | Attending: Obstetrics and Gynecology | Admitting: Obstetrics and Gynecology

## 2022-07-13 ENCOUNTER — Other Ambulatory Visit: Payer: Self-pay

## 2022-07-13 ENCOUNTER — Emergency Department: Payer: Medicaid Other

## 2022-07-13 ENCOUNTER — Encounter
Admission: EM | Disposition: A | Discharge status: Home / Self Care | Payer: Self-pay | Source: Home / Self Care | Attending: Emergency Medicine

## 2022-07-13 ENCOUNTER — Encounter: Payer: Self-pay | Admitting: Anesthesiology

## 2022-07-13 DIAGNOSIS — F17200 Nicotine dependence, unspecified, uncomplicated: Secondary | ICD-10-CM | POA: Insufficient documentation

## 2022-07-13 DIAGNOSIS — R109 Unspecified abdominal pain: Secondary | ICD-10-CM | POA: Diagnosis present

## 2022-07-13 DIAGNOSIS — J45909 Unspecified asthma, uncomplicated: Secondary | ICD-10-CM | POA: Insufficient documentation

## 2022-07-13 DIAGNOSIS — O009 Unspecified ectopic pregnancy without intrauterine pregnancy: Principal | ICD-10-CM | POA: Insufficient documentation

## 2022-07-13 DIAGNOSIS — Z79899 Other long term (current) drug therapy: Secondary | ICD-10-CM | POA: Insufficient documentation

## 2022-07-13 DIAGNOSIS — R58 Hemorrhage, not elsewhere classified: Secondary | ICD-10-CM | POA: Diagnosis not present

## 2022-07-13 DIAGNOSIS — K661 Hemoperitoneum: Secondary | ICD-10-CM | POA: Diagnosis not present

## 2022-07-13 DIAGNOSIS — I959 Hypotension, unspecified: Secondary | ICD-10-CM

## 2022-07-13 HISTORY — PX: DIAGNOSTIC LAPAROSCOPY WITH REMOVAL OF ECTOPIC PREGNANCY: SHX6449

## 2022-07-13 LAB — COMPREHENSIVE METABOLIC PANEL
ALT: 18 U/L (ref 0–44)
AST: 22 U/L (ref 15–41)
Albumin: 2.9 g/dL — ABNORMAL LOW (ref 3.5–5.0)
Alkaline Phosphatase: 35 U/L — ABNORMAL LOW (ref 38–126)
Anion gap: 7 (ref 5–15)
BUN: 12 mg/dL (ref 6–20)
CO2: 19 mmol/L — ABNORMAL LOW (ref 22–32)
Calcium: 7.3 mg/dL — ABNORMAL LOW (ref 8.9–10.3)
Chloride: 108 mmol/L (ref 98–111)
Creatinine, Ser: 0.66 mg/dL (ref 0.44–1.00)
GFR, Estimated: >60 mL/min (ref >60)
Glucose, Bld: 127 mg/dL — ABNORMAL HIGH (ref 70–99)
Potassium: 2.8 mmol/L — ABNORMAL LOW (ref 3.5–5.1)
Sodium: 134 mmol/L — ABNORMAL LOW (ref 135–145)
Total Bilirubin: 0.5 mg/dL (ref 0.3–1.2)
Total Protein: 5.1 g/dL — ABNORMAL LOW (ref 6.5–8.1)

## 2022-07-13 LAB — CBC
HCT: 29.5 % — ABNORMAL LOW (ref 36.0–46.0)
HCT: 32.7 % — ABNORMAL LOW (ref 36.0–46.0)
Hemoglobin: 10 g/dL — ABNORMAL LOW (ref 12.0–15.0)
Hemoglobin: 11 g/dL — ABNORMAL LOW (ref 12.0–15.0)
MCH: 29.6 pg (ref 26.0–34.0)
MCH: 29.4 pg (ref 26.0–34.0)
MCHC: 33.9 g/dL (ref 30.0–36.0)
MCHC: 33.6 g/dL (ref 30.0–36.0)
MCV: 87.3 fL (ref 80.0–100.0)
MCV: 87.4 fL (ref 80.0–100.0)
Platelets: 43 10*3/uL — ABNORMAL LOW (ref 150–400)
Platelets: 170 10*3/uL (ref 150–400)
RBC: 3.38 MIL/uL — ABNORMAL LOW (ref 3.87–5.11)
RBC: 3.74 MIL/uL — ABNORMAL LOW (ref 3.87–5.11)
RDW: 13 % (ref 11.5–15.5)
RDW: 13.3 % (ref 11.5–15.5)
WBC: 4.3 10*3/uL (ref 4.0–10.5)
WBC: 15.6 10*3/uL — ABNORMAL HIGH (ref 4.0–10.5)
nRBC: 0 % (ref 0.0–0.2)
nRBC: 0 % (ref 0.0–0.2)

## 2022-07-13 LAB — LIPASE, BLOOD: Lipase: 22 U/L (ref 11–51)

## 2022-07-13 LAB — HCG, QUANTITATIVE, PREGNANCY: hCG, Beta Chain, Quant, S: 9960 m[IU]/mL — ABNORMAL HIGH (ref <5)

## 2022-07-13 SURGERY — LAPAROSCOPY, WITH ECTOPIC PREGNANCY SURGICAL TREATMENT
Anesthesia: General

## 2022-07-13 MED ORDER — SODIUM CHLORIDE 0.9 % IV SOLN: INTRAVENOUS | Status: DC | PRN

## 2022-07-13 MED ORDER — LACTATED RINGERS IV SOLN: INTRAVENOUS | Status: DC | PRN

## 2022-07-13 MED ORDER — FENTANYL CITRATE PF 50 MCG/ML IJ SOSY
50.0000 ug | PREFILLED_SYRINGE | Freq: Once | INTRAMUSCULAR | Status: AC
  Administered 2022-07-13: 50 ug via INTRAVENOUS
  Filled 2022-07-13: qty 1

## 2022-07-13 MED ORDER — 0.9 % SODIUM CHLORIDE (POUR BTL) OPTIME
TOPICAL | Status: DC | PRN
  Administered 2022-07-13: 500 mL

## 2022-07-13 MED ORDER — ROCURONIUM BROMIDE 100 MG/10ML IV SOLN
INTRAVENOUS | Status: DC | PRN
  Administered 2022-07-13: 30 mg via INTRAVENOUS
  Administered 2022-07-13: 10 mg via INTRAVENOUS

## 2022-07-13 MED ORDER — CALCIUM CHLORIDE 10 % IV SOLN
INTRAVENOUS | Status: AC
  Filled 2022-07-13: qty 10

## 2022-07-13 MED ORDER — SODIUM CHLORIDE 0.9 % IV SOLN: 10.0000 mL/h | Freq: Once | INTRAVENOUS | Status: DC

## 2022-07-13 MED ORDER — DEXAMETHASONE SODIUM PHOSPHATE 10 MG/ML IJ SOLN
INTRAMUSCULAR | Status: DC | PRN
  Administered 2022-07-13: 10 mg via INTRAVENOUS

## 2022-07-13 MED ORDER — SODIUM CHLORIDE 0.9% IV SOLUTION: Freq: Once | INTRAVENOUS | Status: DC

## 2022-07-13 MED ORDER — ONDANSETRON HCL 4 MG/2ML IJ SOLN
INTRAMUSCULAR | Status: AC
  Filled 2022-07-13: qty 2

## 2022-07-13 MED ORDER — PROPOFOL 10 MG/ML IV BOLUS
INTRAVENOUS | Status: AC
  Filled 2022-07-13: qty 20

## 2022-07-13 MED ORDER — ONDANSETRON HCL 4 MG/2ML IJ SOLN
4.0000 mg | Freq: Once | INTRAMUSCULAR | Status: AC
  Administered 2022-07-13: 4 mg via INTRAVENOUS
  Filled 2022-07-13: qty 2

## 2022-07-13 MED ORDER — SUCCINYLCHOLINE CHLORIDE 200 MG/10ML IV SOSY
PREFILLED_SYRINGE | INTRAVENOUS | Status: DC | PRN
  Administered 2022-07-13: 100 mg via INTRAVENOUS

## 2022-07-13 MED ORDER — SUCCINYLCHOLINE CHLORIDE 200 MG/10ML IV SOSY
PREFILLED_SYRINGE | INTRAVENOUS | Status: AC
  Filled 2022-07-13: qty 10

## 2022-07-13 MED ORDER — SUGAMMADEX SODIUM 200 MG/2ML IV SOLN
INTRAVENOUS | Status: DC | PRN
  Administered 2022-07-13: 154.2 mg via INTRAVENOUS

## 2022-07-13 MED ORDER — DEXAMETHASONE SODIUM PHOSPHATE 10 MG/ML IJ SOLN
INTRAMUSCULAR | Status: AC
  Filled 2022-07-13: qty 1

## 2022-07-13 MED ORDER — ONDANSETRON HCL 4 MG/2ML IJ SOLN
INTRAMUSCULAR | Status: DC | PRN
  Administered 2022-07-13: 4 mg via INTRAVENOUS

## 2022-07-13 MED ORDER — ALBUMIN HUMAN 5 % IV SOLN: INTRAVENOUS | Status: DC | PRN

## 2022-07-13 MED ORDER — PROMETHAZINE HCL 25 MG/ML IJ SOLN: 6.2500 mg | INTRAMUSCULAR | Status: DC | PRN

## 2022-07-13 MED ORDER — ROCURONIUM BROMIDE 10 MG/ML (PF) SYRINGE
PREFILLED_SYRINGE | INTRAVENOUS | Status: AC
  Filled 2022-07-13: qty 10

## 2022-07-13 MED ORDER — ETOMIDATE 2 MG/ML IV SOLN
INTRAVENOUS | Status: DC | PRN
  Administered 2022-07-13: 14 mg via INTRAVENOUS

## 2022-07-13 MED ORDER — MIDAZOLAM HCL 2 MG/2ML IJ SOLN
INTRAMUSCULAR | Status: DC | PRN
  Administered 2022-07-13: 2 mg via INTRAVENOUS

## 2022-07-13 MED ORDER — CALCIUM CHLORIDE 10 % IV SOLN
INTRAVENOUS | Status: DC | PRN
  Administered 2022-07-13: 1 g via INTRAVENOUS

## 2022-07-13 MED ORDER — MIDAZOLAM HCL 2 MG/2ML IJ SOLN
INTRAMUSCULAR | Status: AC
  Filled 2022-07-13: qty 2

## 2022-07-13 MED ORDER — FENTANYL CITRATE (PF) 100 MCG/2ML IJ SOLN: 25.0000 ug | INTRAMUSCULAR | Status: DC | PRN

## 2022-07-13 MED ORDER — SODIUM CHLORIDE 0.9 % IV BOLUS
1000.0000 mL | Freq: Once | INTRAVENOUS | Status: AC
  Administered 2022-07-13: 1000 mL via INTRAVENOUS

## 2022-07-13 MED ORDER — LIDOCAINE HCL (CARDIAC) PF 100 MG/5ML IV SOSY
PREFILLED_SYRINGE | INTRAVENOUS | Status: DC | PRN
  Administered 2022-07-13: 100 mg via INTRAVENOUS

## 2022-07-13 MED ORDER — SODIUM CHLORIDE 0.9 % IR SOLN
Status: DC | PRN
  Administered 2022-07-13: 3000 mL

## 2022-07-13 MED ORDER — LIDOCAINE HCL (PF) 2 % IJ SOLN
INTRAMUSCULAR | Status: AC
  Filled 2022-07-13: qty 5

## 2022-07-13 MED ORDER — BUPIVACAINE HCL 0.5 % IJ SOLN
INTRAMUSCULAR | Status: DC | PRN
  Administered 2022-07-13: 15 mL

## 2022-07-13 MED ORDER — FENTANYL CITRATE (PF) 100 MCG/2ML IJ SOLN
INTRAMUSCULAR | Status: AC
  Filled 2022-07-13: qty 2

## 2022-07-13 MED ORDER — ALBUMIN HUMAN 5 % IV SOLN
INTRAVENOUS | Status: AC
  Filled 2022-07-13: qty 500

## 2022-07-13 MED ORDER — PHENYLEPHRINE HCL (PRESSORS) 10 MG/ML IV SOLN
INTRAVENOUS | Status: DC | PRN
  Administered 2022-07-13: 160 ug via INTRAVENOUS
  Administered 2022-07-13: 320 ug via INTRAVENOUS
  Administered 2022-07-13 (×3): 160 ug via INTRAVENOUS

## 2022-07-13 MED ORDER — BUPIVACAINE HCL (PF) 0.5 % IJ SOLN
INTRAMUSCULAR | Status: AC
  Filled 2022-07-13: qty 30

## 2022-07-13 MED ORDER — FENTANYL CITRATE (PF) 100 MCG/2ML IJ SOLN
INTRAMUSCULAR | Status: DC | PRN
  Administered 2022-07-13: 100 ug via INTRAVENOUS

## 2022-07-13 SURGICAL SUPPLY — 47 items
BAG URINE DRAIN 2000ML AR STRL (UROLOGICAL SUPPLIES) ×1 IMPLANT
BLADE SURG SZ11 CARB STEEL (BLADE) ×1 IMPLANT
CATH FOLEY 2WAY  5CC 16FR (CATHETERS) ×1
CATH URTH 16FR FL 2W BLN LF (CATHETERS) ×1 IMPLANT
DERMABOND ADVANCED .7 DNX12 (GAUZE/BANDAGES/DRESSINGS) ×1 IMPLANT
DRAPE STERI POUCH LG 24X46 STR (DRAPES) IMPLANT
GAUZE 4X4 16PLY ~~LOC~~+RFID DBL (SPONGE) ×1 IMPLANT
GLOVE BIO SURGEON STRL SZ7 (GLOVE) ×2 IMPLANT
GLOVE INDICATOR 7.5 STRL GRN (GLOVE) ×1 IMPLANT
GLOVE SURG SYN 8.0 (GLOVE) ×1 IMPLANT
GLOVE SURG SYN 8.0 PF PI (GLOVE) ×1 IMPLANT
GOWN STRL REUS W/ TWL LRG LVL3 (GOWN DISPOSABLE) ×2 IMPLANT
GOWN STRL REUS W/ TWL XL LVL3 (GOWN DISPOSABLE) ×1 IMPLANT
GOWN STRL REUS W/TWL LRG LVL3 (GOWN DISPOSABLE) ×2
GOWN STRL REUS W/TWL XL LVL3 (GOWN DISPOSABLE) ×1
GRASPER SUT TROCAR 14GX15 (MISCELLANEOUS) IMPLANT
IRRIGATION STRYKERFLOW (MISCELLANEOUS) IMPLANT
IRRIGATOR STRYKERFLOW (MISCELLANEOUS) ×1
IV NS 1000ML (IV SOLUTION) ×1
IV NS 1000ML BAXH (IV SOLUTION) ×1 IMPLANT
KIT PINK PAD W/HEAD ARE REST (MISCELLANEOUS) ×1 IMPLANT
KIT PINK PAD W/HEAD ARM REST (MISCELLANEOUS) ×1 IMPLANT
KIT TURNOVER CYSTO (KITS) ×1 IMPLANT
LIGASURE VESSEL 5MM BLUNT TIP (ELECTROSURGICAL) IMPLANT
MANIFOLD NEPTUNE II (INSTRUMENTS) ×1 IMPLANT
NS IRRIG 500ML POUR BTL (IV SOLUTION) ×1 IMPLANT
PACK GYN LAPAROSCOPIC (MISCELLANEOUS) ×1 IMPLANT
PAD OB MATERNITY 4.3X12.25 (PERSONAL CARE ITEMS) ×1 IMPLANT
PAD PREP 24X41 OB/GYN DISP (PERSONAL CARE ITEMS) ×1 IMPLANT
SCRUB CHG 4% DYNA-HEX 4OZ (MISCELLANEOUS) ×1 IMPLANT
SET TUBE SMOKE EVAC HIGH FLOW (TUBING) ×1 IMPLANT
SLEEVE Z-THREAD 5X100MM (TROCAR) ×1 IMPLANT
SOL .9 NS 3000ML IRR  AL (IV SOLUTION) ×1
SOL .9 NS 3000ML IRR UROMATIC (IV SOLUTION) IMPLANT
SOL PREP PVP 2OZ (MISCELLANEOUS) ×1
SOLUTION PREP PVP 2OZ (MISCELLANEOUS) ×1 IMPLANT
SUT MNCRL AB 4-0 PS2 18 (SUTURE) ×1 IMPLANT
SUT VICRYL 0 AB UR-6 (SUTURE) IMPLANT
SYR 50ML LL SCALE MARK (SYRINGE) IMPLANT
SYR 5ML LL (SYRINGE) IMPLANT
SYS BAG RETRIEVAL 10MM (BASKET) ×1
SYSTEM BAG RETRIEVAL 10MM (BASKET) IMPLANT
TRAP FLUID SMOKE EVACUATOR (MISCELLANEOUS) ×1 IMPLANT
TROCAR Z-THREAD FIOS 11X100 BL (TROCAR) IMPLANT
TROCAR Z-THREAD FIOS 5X100MM (TROCAR) ×1 IMPLANT
TROCAR Z-THREAD OPTICAL 5X100M (TROCAR) IMPLANT
WATER STERILE IRR 500ML POUR (IV SOLUTION) ×1 IMPLANT

## 2022-07-13 NOTE — ED Notes (Signed)
[place] at bedside at this time. 2nd unit emergency release blood initiated at this time.

## 2022-07-13 NOTE — ED Notes (Signed)
Pt transported to OR at this time with Harvin Hazel, and Solmon Ice, [place].

## 2022-07-13 NOTE — ED Notes (Signed)
Pt's mom at bedside. This RN, EDP, Aaron Edelman, RN, Yetta Flock, RN, Shawn, RN and Romelle Starcher, RN at bedside at this time. Emergency blood transfusing at this time.

## 2022-07-13 NOTE — Transfer of Care (Signed)
Immediate Anesthesia Transfer of Care Note  Patient: Miranda King  Procedure(s) Performed: Procedure(s): DIAGNOSTIC LAPAROSCOPY WITH REMOVAL OF ECTOPIC PREGNANCY (N/A)  Patient Location: PACU  Anesthesia Type:General  Level of Consciousness: sedated  Airway & Oxygen Therapy: Patient Spontanous Breathing and Patient connected to face mask oxygen  Post-op Assessment: Report given to RN and Post -op Vital signs reviewed and stable  Post vital signs: Reviewed and stable  Last Vitals:  Vitals:   07/13/22 2145 07/13/22 2332  BP: (!) 46/33 128/84  Pulse:  76  Resp:  19  Temp:  (!) 36.4 C  SpO2:  [id number]    Complications: No apparent anesthesia complications

## 2022-07-13 NOTE — Anesthesia Procedure Notes (Signed)
Procedure Name: Intubation Date/Time: 07/13/2022 9:56 PM  Performed by: Doreen Salvage, CRNAPre-anesthesia Checklist: Patient identified, Emergency Drugs available, Suction available and Patient being monitored Patient Re-evaluated:Patient Re-evaluated prior to induction Oxygen Delivery Method: Circle system utilized Preoxygenation: Pre-oxygenation with 100% oxygen Induction Type: IV induction, Cricoid Pressure applied and Rapid sequence Ventilation: Mask ventilation without difficulty Laryngoscope Size: Mac and 3 Grade View: Grade II Tube type: Oral Tube size: 7.0 mm Number of attempts: 1 Airway Equipment and Method: Stylet Placement Confirmation: ETT inserted through vocal cords under direct vision, positive ETCO2 and breath sounds checked- equal and bilateral Secured at: 21 cm Tube secured with: Tape Dental Injury: Teeth and Oropharynx as per pre-operative assessment

## 2022-07-13 NOTE — ED Triage Notes (Signed)
Arrives via [facility]. EMS w/ c/c of diffuse abdominal pain x1 hour.  Pain is concentrated in LLQ, no rigidity or distention noted by EMS.  Several episodes of emesis since onset of ABD pain, denies hematemesis. No hx of abdominal surgeries.  Endorses associated chills and hot flashes.  Last menstrual period 2 days ago.  Last BM this AM.

## 2022-07-13 NOTE — Anesthesia Preprocedure Evaluation (Signed)
Anesthesia Evaluation  Patient identified by MRN, date of birth, ID band Patient awake    Reviewed: Allergy & Precautions, H&P , NPO status , Patient's Chart, lab work & pertinent test results, reviewed documented beta blocker date and time   History of Anesthesia Complications Negative for: history of anesthetic complications  Airway Mallampati: I  TM Distance: >3 FB Neck ROM: full    Dental  (+) Dental Advidsory Given, Teeth Intact   Pulmonary neg shortness of breath, asthma , neg sleep apnea, neg recent URI, Current Smoker   Pulmonary exam normal breath sounds clear to auscultation       Cardiovascular Exercise Tolerance: Good negative cardio ROS Normal cardiovascular exam Rhythm:regular Rate:Normal     Neuro/Psych  Headaches, Seizures -, Well Controlled,   negative psych ROS   GI/Hepatic negative GI ROS, Neg liver ROS,,,  Endo/Other  negative endocrine ROS    Renal/GU negative Renal ROS  negative genitourinary   Musculoskeletal   Abdominal   Peds  Hematology negative hematology ROS (+)   Anesthesia Other Findings Past Medical History: No date: Asthma No date: Seizures ([place]) No date: Tobacco use disorder   Reproductive/Obstetrics negative OB ROS                             Anesthesia Physical Anesthesia Plan  ASA: 2 and emergent  Anesthesia Plan: General   Post-op Pain Management:    Induction: Intravenous, Rapid sequence and Cricoid pressure planned  PONV Risk Score and Plan: 2 and Ondansetron, Dexamethasone and Treatment may vary due to age or medical condition  Airway Management Planned: Oral ETT  Additional Equipment:   Intra-op Plan:   Post-operative Plan: Extubation in OR  Informed Consent: I have reviewed the patients History and Physical, chart, labs and discussed the procedure including the risks, benefits and alternatives for the proposed anesthesia with the  patient or authorized representative who has indicated his/her understanding and acceptance.     Dental Advisory Given  Plan Discussed with: Anesthesiologist, CRNA and Surgeon  Anesthesia Plan Comments:        Anesthesia Quick Evaluation

## 2022-07-13 NOTE — Progress Notes (Signed)
Pt BP drop to 46/33.  Pt immediately placed in trendelenburg and LR bolus placed on pressure bag.  Dr. Shary Decamp was at PACU desk and anesthesia paged overhead to pacu.  Pt taken immediately into OR by anesthesia team.

## 2022-07-13 NOTE — H&P (Cosign Needed Addendum)
Consult History and Physical   SERVICE: Gynecology   Patient Name: Miranda King Patient MRN:   [id number]  CC: abdominal pain    HPI: Miranda King is a 27 y.o. G26P2012 female with no significant past medical history presents to the emergency department for diffuse abdominal pain and nausea. According to mom patient has been acting normal throughout the day, they went shopping, she states this afternoon acutely again complaining of severe diffuse abdominal pain more so on the lower abdomen and first and then diffusely throughout the abdomen. Patient states nausea and vomited x 1. No diarrhea. States she had had some recent dysuria. Denies any vaginal bleeding or discharge, last menstrual period 3 days ago per patient. Currently patient is moaning in pain holding her abdomen.  Beta hcg returned >9900; pt states she did not know she was pregnant. Nexplanon removed last month, had bleeding on March 26 for 2 hours.  On arrival, she was in significant distress. She received 2 units of emergency released blood in the ER.  2 prior NSVDs, 29yr old and 29 yr old. She states does not want to die  Review of Systems: positives in bold GEN:   fevers, chills, weight changes, appetite changes, fatigue, night sweats HEENT:  HA, vision changes, hearing loss, congestion, rhinorrhea, sinus pressure, dysphagia CV:   CP, palpitations PULM:  SOB, cough GI:  abd pain, N/V/D/C GU:  dysuria, urgency, frequency MSK:  arthralgias, myalgias, back pain, swelling SKIN:  rashes, color changes, pallor NEURO:  numbness, weakness, tingling, seizures, dizziness, tremors PSYCH:  depression, anxiety, behavioral problems, confusion  HEME/LYMPH:  easy bruising or bleeding ENDO:  heat/cold intolerance  Past Obstetrical History: OB History     Gravida  3   Para  2   Term  2   Preterm      AB  1   Living  2      SAB      IAB  1   Ectopic      Multiple  0   Live Births  2            Past Gynecologic History: Patient's last menstrual period was 07/12/2022.   Past Medical History: Past Medical History:  Diagnosis Date   Asthma    Seizures ([place])    Tobacco use disorder     Past Surgical History:   Past Surgical History:  Procedure Laterality Date   DILATION AND EVACUATION N/A 10/03/2015   Procedure: DILATATION AND EVACUATION;  Surgeon: Gae Dry, MD;  Location: [facility] ORS;  Service: Gynecology;  Laterality: N/A;   VAGINAL DELIVERY      Family History:  family history includes Asthma in her sister; Diabetes in her maternal grandmother; Hypertension in her maternal grandmother; Migraines in her mother.  Social History:  Social History   Socioeconomic History   Marital status: Single    Spouse name: Not on file   Number of children: Not on file   Years of education: Not on file   Highest education level: Not on file  Occupational History   Not on file  Tobacco Use   Smoking status: Some Days   Smokeless tobacco: Never   Tobacco comments:    Had a cigarette prior to arriving at the hospital.   Vaping Use   Vaping Use: Never used  Substance and Sexual Activity   Alcohol use: No    Alcohol/week: 0.0 standard drinks of alcohol   Drug use: No   Sexual  activity: Yes    Birth control/protection: None  Other Topics Concern   Not on file  Social History Narrative   Not on file   Social Determinants of Health   Financial Resource Strain: Not on file  Food Insecurity: Not on file  Transportation Needs: Not on file  Physical Activity: Not on file  Stress: Not on file  Social Connections: Not on file  Intimate Partner Violence: Not on file    Home Medications:  Medications reconciled in EPIC  No current facility-administered medications on file prior to encounter.   Current Outpatient Medications on File Prior to Encounter  Medication Sig Dispense Refill   albuterol (VENTOLIN HFA) 108 (90 Base) MCG/ACT inhaler Inhale into the lungs.      cyanocobalamin (VITAMIN B12) 1000 MCG/ML injection Inject 33ml once a week for four weeks then once a month for four months.     lamoTRIgine (LAMICTAL) 25 MG tablet Week 1: 25 mg daily,Week 2: 25 mg two times a day,Week 3: 25 mg in the am, 50 mg in the pm,Week 4: 50 mg two times a day,Week 5: 50 mg in the am, 75 mg in the pm,Week 6: 75 mg two times a day, Week 7: 75 mg in the am, 100 mg in the pm,Week 8: 100 mg two times a day     Prenatal Multivit-Min-Fe-FA (PRENATAL VITAMINS) 0.8 MG tablet Take 1 tablet by mouth daily. (Patient not taking: No sig reported) 30 tablet 12   SUBOXONE 8-2 MG FILM       Allergies:  Allergies  Allergen Reactions   Tramadol Hcl Nausea And Vomiting    Physical Exam:  Temp:  [97.4 F (36.3 C)-97.5 F (36.4 C)] 97.4 F (36.3 C) (03/30 2113) Pulse Rate:  [86-118] 92 (03/30 2120) Resp:  [9-31] 19 (03/30 2120) BP: (73-146)/(34-96) 90/77 (03/30 2120) SpO2:  [92 %-100 %] 98 % (03/30 2120) Weight:  [77.1 kg] 77.1 kg (03/30 1927)   General Appearance:  Well developed, well nourished, no severe distress, alert and oriented x3 HEENT:  Normocephalic atraumatic, extraocular movements intact, moist mucous membranes Cardiovascular:  Normal S1/S2, regular rate and rhythm, no murmurs Pulmonary: shallow due to pain Abdomen:  Abd firm, TTP throughout, +peritoneal signs Neurologic:  Cranial nerves 2-12 grossly intact,  Psychiatric:  Normal mood and affect, appropriate, no AH/VH Pelvic:  deferred  Labs/Studies:   CBC and Coags:  Lab Results  Component Value Date   WBC 4.3 07/13/2022   NEUTOPHILPCT 69 06/12/2017   EOSPCT 0.7 07/05/2012   BASOPCT 0.3 07/05/2012   LYMPHOPCT 28.5 07/05/2012   HGB 10.0 (L) 07/13/2022   HCT 29.5 (L) 07/13/2022   MCV 87.3 07/13/2022   PLT 43 (L) 07/13/2022   INR 1.0 02/28/2012   CMP:  Lab Results  Component Value Date   NA 134 (L) 07/13/2022   K 2.8 (L) 07/13/2022   CL 108 07/13/2022   CO2 19 (L) 07/13/2022   BUN 12 07/13/2022    CREATININE 0.66 07/13/2022   CREATININE 0.79 12/08/2017   CREATININE 0.47 03/28/2017   PROT 5.1 (L) 07/13/2022   BILITOT 0.5 07/13/2022   ALT 18 07/13/2022   AST 22 07/13/2022   ALKPHOS 35 (L) 07/13/2022   Other Labs:   Other Imaging: No results found.   Assessment / Plan:   Pasqualina L ARBAIZA is a 27 y.o. [id number] who presents with ruptured ectopic suspected, emergently to the OR. In PACU, +syncopy with 46/33 BP.  Ectopic pregnancy: Causes discussed with patient,  including prevalence, common causes, and outcomes.  Consents signed today. Risks of dx lap with salpingectomy and evacuation of hemoperitoneum surgery were discussed with the patient including but not limited to: bleeding which may require transfusion; infection which may require antibiotics; injury to uterus or surrounding organs; intrauterine scarring which may impair future fertility if D&C is required; need for additional procedures including laparotomy or laparoscopy; and other postoperative/anesthesia complications. Written informed consent was obtained.  This is a scheduled same-day surgery. She will have a postop visit in 2 weeks to review operative findings and pathology.

## 2022-07-13 NOTE — ED Provider Notes (Signed)
[facility] Provider Note    Event Date/Time   First MD Initiated Contact with Patient 07/13/22 1929     (approximate)  History   Chief Complaint: Abdominal Pain and Emesis  HPI  Miranda King is a 27 y.o. female with no significant past medical history presents to the emergency department for diffuse abdominal pain and nausea.  According to mom patient has been acting normal throughout the day, they went shopping, she states this afternoon acutely again complaining of severe diffuse abdominal pain more so on the lower abdomen and first and then diffusely throughout the abdomen.  Patient states nausea and vomited x 1.  No diarrhea.  States she had had some recent dysuria.  Denies any vaginal bleeding or discharge, last menstrual period 3 days ago per patient.  Currently patient is moaning in pain holding her abdomen.  Physical Exam   Triage Vital Signs: ED Triage Vitals  Enc Vitals Group     BP 07/13/22 1926 (!) 94/58     Pulse Rate 07/13/22 1927 (!) 104     Resp 07/13/22 1926 (!) 22     Temp 07/13/22 1926 (!) 97.5 F (36.4 C)     Temp Source 07/13/22 1926 Oral     SpO2 07/13/22 1926 97 %     Weight 07/13/22 1927 170 lb (77.1 kg)     Height --      Head Circumference --      Peak Flow --      Pain Score --      Pain Loc --      Pain Edu? --      Excl. in [place]? --     Most recent vital signs: Vitals:   07/13/22 1926 07/13/22 1927  BP: (!) 94/58   Pulse:  (!) 104  Resp: (!) 22   Temp: (!) 97.5 F (36.4 C)   SpO2: 97%     General: Awake, mild to moderate distress due to abdominal pain. CV:  Good peripheral perfusion.  Regular rate and rhythm  Resp:  Normal effort.  Equal breath sounds bilaterally.  Abd:  No distention.  Soft, moderate diffuse tenderness.  No focal tenderness identified.  No guarding.  ED Results / Procedures / Treatments   RADIOLOGY  Ultrasound read pending   MEDICATIONS ORDERED IN ED: Medications  fentaNYL  (SUBLIMAZE) injection 50 mcg (has no administration in time range)  ondansetron (ZOFRAN) injection 4 mg (has no administration in time range)  sodium chloride 0.9 % bolus 1,000 mL (has no administration in time range)     IMPRESSION / MDM / ASSESSMENT AND PLAN / ED COURSE  I reviewed the triage vital signs and the nursing notes.  Patient's presentation is most consistent with acute presentation with potential threat to life or bodily function.  Patient presents emergency department for diffuse abdominal pain nausea and vomiting starting several hours ago.  Prior to that mom states the patient was acting normal, dying Easter eggs and in no distress.  Currently patient in moderate distress due to abdominal pain, moaning holding her abdomen.  Will answer questions but appears agitated at times likely due to the discomfort.  We will check labs and obtain a CT scan abdomen/pelvis to further evaluate.  Differential would include intra-abdominal pathology such as appendicitis, gallbladder pathology, SBO, UTI or pyelonephritis, given the acute nature of the events also on the differential would include an ectopic pregnancy.  We will treat with fentanyl Zofran IV hydrate  while awaiting results.  Patient's blood pressure initially soft has trended down despite a liter of IV fluids.  Patient continues to complain of diffuse abdominal pain.  Beta-hCG is resulted positive at 9900, raising concern for ruptured ectopic pregnancy.  I used a bedside ultrasound and appeared to see a fairly significant amount of free fluid within the abdomen concerning for blood.  I have ordered 2 units of emergency release blood, patient's blood pressure is down trended now to 73 systolic.  We have obtained 2 large-bore IVs including a 16-gauge infusing 2 additional liters of normal saline under pressure bags.  I have spoken to Dr. Leafy Ro of OB/GYN who will be in shortly to see the patient and is calling the OR to prep the OR for  surgery.  I have informed the patient of the findings and the plan for likely emergent surgery.  Mom is here with the patient as well.  Patient's blood pressure is improving with fluids and blood and the patient is mentating much better.  Pressure currently 112/95.  Will attempt to obtain ultrasound images with the tech in the room however I do not believe the patient should leave the room to go to the ultrasound suite at this time.  ----------------------------------------- 9:32 PM on 07/13/2022 ----------------------------------------- Patient is being wheeled to the OR.  We were able to get a bedside ultrasound performed, Dr. Leafy Ro is aware and is reviewing the images.  CRITICAL CARE Performed by: Harvest Dark   Total critical care time: 60 minutes  Critical care time was exclusive of separately billable procedures and treating other patients.  Critical care was necessary to treat or prevent imminent or life-threatening deterioration.  Critical care was time spent personally by me on the following activities: development of treatment plan with patient and/or surrogate as well as nursing, discussions with consultants, evaluation of patient's response to treatment, examination of patient, obtaining history from patient or surrogate, ordering and performing treatments and interventions, ordering and review of laboratory studies, ordering and review of radiographic studies, pulse oximetry and re-evaluation of patient's condition.   FINAL CLINICAL IMPRESSION(S) / ED DIAGNOSES   Abdominal pain, diffuse Ruptured ectopic   Note:  This document was prepared using Dragon voice recognition software and may include unintentional dictation errors.   Harvest Dark, MD 07/13/22 2133

## 2022-07-13 NOTE — ED Notes (Signed)
US at bedside at this time 

## 2022-07-14 ENCOUNTER — Encounter: Payer: Self-pay | Admitting: Obstetrics and Gynecology

## 2022-07-14 ENCOUNTER — Other Ambulatory Visit: Payer: Self-pay

## 2022-07-14 DIAGNOSIS — K661 Hemoperitoneum: Secondary | ICD-10-CM | POA: Diagnosis present

## 2022-07-14 LAB — CBC
HCT: 30.5 % — ABNORMAL LOW (ref 36.0–46.0)
Hemoglobin: 10.7 g/dL — ABNORMAL LOW (ref 12.0–15.0)
MCH: 29.8 pg (ref 26.0–34.0)
MCHC: 35.1 g/dL (ref 30.0–36.0)
MCV: 85 fL (ref 80.0–100.0)
Platelets: 159 10*3/uL (ref 150–400)
RBC: 3.59 MIL/uL — ABNORMAL LOW (ref 3.87–5.11)
RDW: 13.7 % (ref 11.5–15.5)
WBC: 10.7 10*3/uL — ABNORMAL HIGH (ref 4.0–10.5)
nRBC: 0 % (ref 0.0–0.2)

## 2022-07-14 LAB — BPAM FFP
Blood Product Expiration Date: 202404022359
Blood Product Expiration Date: 202404022359
ISSUE DATE / TIME: 202403302357
ISSUE DATE / TIME: 202403302357
Unit Type and Rh: 5100
Unit Type and Rh: 5100

## 2022-07-14 LAB — BASIC METABOLIC PANEL
Anion gap: 5 (ref 5–15)
BUN: 11 mg/dL (ref 6–20)
CO2: 22 mmol/L (ref 22–32)
Calcium: 8.5 mg/dL — ABNORMAL LOW (ref 8.9–10.3)
Chloride: 109 mmol/L (ref 98–111)
Creatinine, Ser: 0.66 mg/dL (ref 0.44–1.00)
GFR, Estimated: >60 mL/min (ref >60)
Glucose, Bld: 128 mg/dL — ABNORMAL HIGH (ref 70–99)
Potassium: 4.1 mmol/L (ref 3.5–5.1)
Sodium: 136 mmol/L (ref 135–145)

## 2022-07-14 LAB — PREPARE FRESH FROZEN PLASMA

## 2022-07-14 MED ORDER — LACTATED RINGERS IV SOLN: INTRAVENOUS | Status: DC

## 2022-07-14 MED ORDER — IBUPROFEN 800 MG PO TABS: 800.0000 mg | ORAL_TABLET | Freq: Three times a day (TID) | ORAL | 1 refills | Status: AC

## 2022-07-14 MED ORDER — MENTHOL 3 MG MT LOZG: 1.0000 | LOZENGE | OROMUCOSAL | Status: DC | PRN

## 2022-07-14 MED ORDER — ACETAMINOPHEN EXTRA STRENGTH 500 MG PO TABS: 1000.0000 mg | ORAL_TABLET | Freq: Four times a day (QID) | ORAL | 0 refills | Status: AC

## 2022-07-14 MED ORDER — ALUM & MAG HYDROXIDE-SIMETH 200-200-20 MG/5ML PO SUSP: 30.0000 mL | ORAL | Status: DC | PRN

## 2022-07-14 MED ORDER — ONDANSETRON HCL 4 MG PO TABS: 4.0000 mg | ORAL_TABLET | Freq: Four times a day (QID) | ORAL | Status: DC | PRN

## 2022-07-14 MED ORDER — IBUPROFEN 600 MG PO TABS: 600.0000 mg | ORAL_TABLET | Freq: Four times a day (QID) | ORAL | 0 refills | Status: DC

## 2022-07-14 MED ORDER — DOCUSATE SODIUM 100 MG PO CAPS
100.0000 mg | ORAL_CAPSULE | Freq: Two times a day (BID) | ORAL | Status: DC
  Administered 2022-07-14 (×2): 100 mg via ORAL
  Filled 2022-07-14 (×2): qty 1

## 2022-07-14 MED ORDER — ONDANSETRON HCL 4 MG/2ML IJ SOLN: 4.0000 mg | Freq: Four times a day (QID) | INTRAMUSCULAR | Status: DC | PRN

## 2022-07-14 MED ORDER — OXYCODONE HCL 5 MG PO TABS: 5.0000 mg | ORAL_TABLET | ORAL | 0 refills | Status: DC | PRN

## 2022-07-14 MED ORDER — DOCUSATE SODIUM 100 MG PO CAPS: 100.0000 mg | ORAL_CAPSULE | Freq: Two times a day (BID) | ORAL | 0 refills | Status: DC

## 2022-07-14 MED ORDER — ALBUTEROL SULFATE (2.5 MG/3ML) 0.083% IN NEBU: 2.5000 mg | INHALATION_SOLUTION | RESPIRATORY_TRACT | Status: DC | PRN

## 2022-07-14 MED ORDER — OXYCODONE HCL 5 MG PO TABS
5.0000 mg | ORAL_TABLET | ORAL | Status: DC | PRN
  Administered 2022-07-14 (×3): 10 mg via ORAL
  Filled 2022-07-14 (×4): qty 2

## 2022-07-14 MED ORDER — KETOROLAC TROMETHAMINE 30 MG/ML IJ SOLN
30.0000 mg | Freq: Four times a day (QID) | INTRAMUSCULAR | Status: DC
  Administered 2022-07-14 (×3): 30 mg via INTRAVENOUS
  Filled 2022-07-14 (×3): qty 1

## 2022-07-14 MED ORDER — IBUPROFEN 600 MG PO TABS: 600.0000 mg | ORAL_TABLET | Freq: Four times a day (QID) | ORAL | Status: DC

## 2022-07-14 MED ORDER — SIMETHICONE 80 MG PO CHEW
80.0000 mg | CHEWABLE_TABLET | Freq: Four times a day (QID) | ORAL | Status: DC | PRN
  Administered 2022-07-14 (×3): 80 mg via ORAL
  Filled 2022-07-14 (×3): qty 1

## 2022-07-14 MED ORDER — SIMETHICONE 80 MG PO CHEW: 80.0000 mg | CHEWABLE_TABLET | Freq: Four times a day (QID) | ORAL | 0 refills | Status: DC | PRN

## 2022-07-14 MED ORDER — GABAPENTIN 100 MG PO CAPS
100.0000 mg | ORAL_CAPSULE | Freq: Every day | ORAL | Status: DC
  Administered 2022-07-14: 100 mg via ORAL
  Filled 2022-07-14: qty 1

## 2022-07-14 NOTE — Op Note (Signed)
Miranda King PROCEDURE DATE: 07/13/2022  PREOPERATIVE DIAGNOSIS: Ruptured ectopic pregnancy, life-threatening hemorrhage  POSTOPERATIVE DIAGNOSIS: Ruptured right fallopian tube ectopic pregnancy; hemoperitoneum PROCEDURE: Laparoscopic right salpingectomy and removal of ectopic pregnancy; evaculation of 2534ml of hemoperitoneum SURGEON:  Dr. Benjaman Kindler ANESTHESIOLOGIST: No responsible provider has been recorded for the case. Anesthesiologist: Martha Clan, MD CRNA: Doreen Salvage, CRNA  INDICATIONS: 27 y.o. [phone] at Unknown here with the preoperative diagnoses as listed above.  Please refer to preoperative notes for more details. Patient was counseled regarding need for laparoscopic salpingectomy. Risks of surgery including bleeding which may require transfusion or reoperation, infection, injury to bowel or other surrounding organs, need for additional procedures including laparotomy and other postoperative/anesthesia complications were explained to patient.  Written informed consent was obtained.  FINDINGS:  Large amount of hemoperitoneum measured about 2556ml of blood and clots.  Dilated right fallopian tube containing ectopic gestation. Small normal appearing uterus, normal left fallopian tube, right ovary and left ovary.   Normal appendix. Enlarged cecum. 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  She was placed in the dorsal lithotomy position, and was prepped and draped in a sterile manner.  A foley was  placed and a single toothed tenaculum placed on the anterior cervix for uterine manipulation  Attention was turned to the abdomen where an umbilical incision was made with the scalpel.  The Optiview 5-mm trocar and sleeve were then advanced without difficulty. Survey of the entry site was noted to be without damage. The abdomen was then insufflated with carbon dioxide gas and adequate pneumoperitoneum was obtained.  A survey of the patient's pelvis and abdomen revealed the findings above.  A 11-mm port was placed on the left under direct visualization, and a 5-mm right lower quadrant port was then placed under direct visualization.  The Nezhat suction irrigator was then used to suction the hemoperitoneum and irrigate the pelvis.  A survey of the pelvis noted ureter peristalsis deep to the ovarian ligament and not in the surgical field.   Attention was then turned to the right fallopian tube and ovary. The left tube was separated from the underlying mesosalpinx and uterine attachment using the Ligasure instrument.  Good hemostasis was noted.  The specimen was placed in an EndoCatch bag and removed from the abdomen intact.    Extensive blood loss suctioned, and reverse T burg with extensive irrigation to clear as much of the blood as possible.  The abdomen was desufflated, and all instruments were removed.  The fascial incision of the 10-mm site was reapproximated with a 0 Vicryl figure-of-eight stitch; and all skin incisions were closed with 4-0 Vicryl and Dermabond. The patient tolerated the procedure well.  All instruments, needles, and sponge counts were correct x 2. The patient was taken to the recovery room in stable condition.   Given the amount of blood loss, patient will be observed overnight in the hospital.  Will discharge in the morning if she remains stable.  Will recheck hemoglobin later tonight and ascertain need for further transfusion.  She also  has electrolyte abnormalities to follow and  will replete if needed.  [name], MD, MPH

## 2022-07-14 NOTE — Progress Notes (Signed)
Patient discharged home with family.  Discharge instructions, when to follow up, and prescriptions reviewed with patient.  Patient verbalized understanding. Patient will be escorted out by auxiliary.   

## 2022-07-14 NOTE — Discharge Instructions (Signed)
      Laparoscopic Tubal Removal for Ectopic Pregnancy Discharge Instructions  Laparoscopic tubal removal is a procedure that removes the fallopian tube containing the ectopic pregnancy.  For the next three days, take ibuprofen and acetaminophen on a schedule, every 8 hours. You can take them together or you can intersperse them, and take one every four hours. I also gave you gabapentin for nighttime, to help you sleep and also to control pain. Take gabapentin medicines at night for at least the next 3 nights. You also have a narcotic, oxycodone, to take as needed if the above medicines don't help.  Postop constipation is a major cause of pain. Stay well hydrated, walk as you tolerate, and take over the counter senna as well as stool softeners if you need them.  RISKS AND COMPLICATIONS  Infection. Bleeding. Injury to surrounding organs. Anesthetic side effects. Failure of the procedure. Risks of future ectopic pregnancy on the other side PROCEDURE  You may be given a medicine to help you relax (sedative) before the procedure. You will be given a medicine to make you sleep (general anesthetic) during the procedure. A tube will be put down your throat to help your breath while under general anesthesia. Two small cuts (incisions) are made in the lower abdominal area and one incision is made near the belly button. Your abdominal area will be inflated with a safe gas (carbon dioxide). This helps give the surgeon room to operate, visualize, and helps the surgeon avoid other organs. A thin, lighted tube (laparoscope) with a camera attached is inserted into your abdomen through the incision near the belly button. Other small instruments are also inserted through the other abdominal incisions. The fallopian tube is located and are removed. After the fallopian tube is removed, the gas is released from the abdomen. The incisions will be closed with stitches (sutures), and Dermabond. A bandage may be  placed over the incisions. AFTER THE PROCEDURE  You will also have some mild abdominal discomfort for 3-7 days. You will be given pain medicine to ease any discomfort. As long as there are no problems, you may be allowed to go home. Someone will need to drive you home and be with you for at least 24 hours once home. You may have some mild discomfort in the throat. This is from the tube placed in your throat while you were sleeping. You may experience discomfort in the shoulder area from some trapped air between the liver and diaphragm. This sensation is normal and will slowly go away on its own. HOME CARE INSTRUCTIONS  Take all medicines as directed. Only take over-the-counter or prescription medicines for pain, discomfort, or fever as directed by your caregiver. Resume daily activities as directed. Showers are preferred over baths. You may resume sexual activities in 1 week or as directed. Do not drive while taking narcotics. SEEK MEDICAL CARE IF: . There is increasing abdominal pain. You feel lightheaded or faint. You have the chills. You have an oral temperature above 102 F (38.9 C). There is pus-like (purulent) drainage from any of the wounds. You are unable to pass gas or have a bowel movement. You feel sick to your stomach (nauseous) or throw up (vomit). MAKE SURE YOU:  Understand these instructions. Will watch your condition. Will get help right away if you are not doing well or get worse.  [employer] Patient Information 2013 [employer].    

## 2022-07-14 NOTE — Discharge Summary (Signed)
1 Day Post-Op       Procedure(s): DIAGNOSTIC LAPAROSCOPY WITH REMOVAL OF ECTOPIC PREGNANCY (N/A) Subjective: The patient is doing well.  No nausea or vomiting. Pain is adequately controlled.  Objective: Vital signs in last 24 hours: Temp:  [97.4 F (36.3 C)-99.6 F (37.6 C)] 99.6 F (37.6 C) (03/31 1114) Pulse Rate:  [65-118] 69 (03/31 1114) Resp:  [9-31] 18 (03/31 1114) BP: (46-146)/(33-96) 109/53 (03/31 1114) SpO2:  [92 %-100 %] 100 % (03/31 0741) Weight:  [77.1 kg] 77.1 kg (03/30 1927)  Intake/Output  Intake/Output Summary (Last 24 hours) at 07/14/2022 1550 Last data filed at 07/14/2022 1200 Gross per 24 hour  Intake 3177 ml  Output 5600 ml  Net -2423 ml    Physical Exam:  General: Alert and oriented. CV: RRR Lungs: Clear bilaterally. GI: Soft, Nondistended. Incisions: Clean and dry. Urine: Clear,  Extremities: Nontender, no erythema, no edema.  Lab Results: Recent Labs    07/13/22 1943 07/13/22 2130 07/14/22 0452  HGB 10.0* 11.0* 10.7*  HCT 29.5* 32.7* 30.5*  WBC 4.3 15.6* 10.7*  PLT 43* 170 159                 Results for orders placed or performed during the hospital encounter of 07/13/22 (from the past 24 hour(s))  Lipase, blood     Status: None   Collection Time: 07/13/22  7:43 PM  Result Value Ref Range   Lipase 22 11 - 51 U/L  Comprehensive metabolic panel     Status: Abnormal   Collection Time: 07/13/22  7:43 PM  Result Value Ref Range   Sodium 134 (L) 135 - 145 mmol/L   Potassium 2.8 (L) 3.5 - 5.1 mmol/L   Chloride 108 98 - 111 mmol/L   CO2 19 (L) 22 - 32 mmol/L   Glucose, Bld 127 (H) 70 - 99 mg/dL   BUN 12 6 - 20 mg/dL   Creatinine, Ser 0.66 0.44 - 1.00 mg/dL   Calcium 7.3 (L) 8.9 - 10.3 mg/dL   Total Protein 5.1 (L) 6.5 - 8.1 g/dL   Albumin 2.9 (L) 3.5 - 5.0 g/dL   AST 22 15 - 41 U/L   ALT 18 0 - 44 U/L   Alkaline Phosphatase 35 (L) 38 - 126 U/L   Total Bilirubin 0.5 0.3 - 1.2 mg/dL   GFR, Estimated >60 >60 mL/min   Anion gap 7 5 -  15  CBC     Status: Abnormal   Collection Time: 07/13/22  7:43 PM  Result Value Ref Range   WBC 4.3 4.0 - 10.5 K/uL   RBC 3.38 (L) 3.87 - 5.11 MIL/uL   Hemoglobin 10.0 (L) 12.0 - 15.0 g/dL   HCT 29.5 (L) 36.0 - 46.0 %   MCV 87.3 80.0 - 100.0 fL   MCH 29.6 26.0 - 34.0 pg   MCHC 33.9 30.0 - 36.0 g/dL   RDW 13.0 11.5 - 15.5 %   Platelets 43 (L) 150 - 400 K/uL   nRBC 0.0 0.0 - 0.2 %  hCG, quantitative, pregnancy     Status: Abnormal   Collection Time: 07/13/22  7:43 PM  Result Value Ref Range   hCG, Beta Chain, Quant, S 9,960 (H) <5 mIU/mL  Prepare RBC (crossmatch)     Status: None   Collection Time: 07/13/22  8:45 PM  Result Value Ref Range   Order Confirmation      ORDER PROCESSED BY BLOOD BANK Performed at [facility], [address] [place], [place] [address]   Type and screen [place]     Status: None (Preliminary result)   Collection Time: 07/13/22  8:53 PM  Result Value Ref Range   ABO/RH(D) O POS    Antibody Screen NEG    Sample Expiration 07/16/2022,2359    Unit Number [id number]    Blood Component Type RED CELLS,LR    Unit division 00    Status of Unit ISSUED,FINAL    Transfusion Status OK TO TRANSFUSE    Crossmatch Result COMPATIBLE    Unit Number [id number]    Blood Component Type RED CELLS,LR    Unit division 00    Status of Unit ISSUED,FINAL    Transfusion Status OK TO TRANSFUSE    Crossmatch Result COMPATIBLE    Unit Number [id number]    Blood Component Type RED CELLS,LR    Unit division 00    Status of Unit ISSUED,FINAL    Transfusion Status OK TO TRANSFUSE    Crossmatch Result Compatible    Unit Number [id number]    Blood Component Type RBC LR PHER1    Unit division 00    Status of Unit ISSUED,FINAL    Transfusion Status OK TO TRANSFUSE    Crossmatch Result Compatible    Unit Number [id number]    Blood Component Type RED CELLS,LR    Unit division 00    Status of Unit QUARANTINED    Transfusion  Status OK TO TRANSFUSE    Crossmatch Result Compatible   CBC     Status: Abnormal   Collection Time: 07/13/22  9:30 PM  Result Value Ref Range   WBC 15.6 (H) 4.0 - 10.5 K/uL   RBC 3.74 (L) 3.87 - 5.11 MIL/uL   Hemoglobin 11.0 (L) 12.0 - 15.0 g/dL   HCT 32.7 (L) 36.0 - 46.0 %   MCV 87.4 80.0 - 100.0 fL   MCH 29.4 26.0 - 34.0 pg   MCHC 33.6 30.0 - 36.0 g/dL   RDW 13.3 11.5 - 15.5 %   Platelets 170 150 - 400 K/uL   nRBC 0.0 0.0 - 0.2 %  Prepare fresh frozen plasma     Status: None   Collection Time: 07/13/22 11:59 PM  Result Value Ref Range   Unit Number [id number]    Blood Component Type THW PLS APHR    Unit division A0    Status of Unit ISSUED,FINAL    Transfusion Status OK TO TRANSFUSE    Unit Number [id number]    Blood Component Type THW PLS APHR    Unit division B0    Status of Unit ISSUED,FINAL    Transfusion Status OK TO TRANSFUSE   CBC     Status: Abnormal   Collection Time: 07/14/22  4:52 AM  Result Value Ref Range   WBC 10.7 (H) 4.0 - 10.5 K/uL   RBC 3.59 (L) 3.87 - 5.11 MIL/uL   Hemoglobin 10.7 (L) 12.0 - 15.0 g/dL   HCT 30.5 (L) 36.0 - 46.0 %   MCV 85.0 80.0 - 100.0 fL   MCH 29.8 26.0 - 34.0 pg   MCHC 35.1 30.0 - 36.0 g/dL   RDW 13.7 11.5 - 15.5 %   Platelets 159 150 - 400 K/uL   nRBC 0.0 0.0 - 0.2 %  Basic metabolic panel     Status: Abnormal   Collection Time: 07/14/22  4:52 AM  Result Value Ref Range   Sodium 136 135 - 145 mmol/L   Potassium 4.1 3.5 -  5.1 mmol/L   Chloride 109 98 - 111 mmol/L   CO2 22 22 - 32 mmol/L   Glucose, Bld 128 (H) 70 - 99 mg/dL   BUN 11 6 - 20 mg/dL   Creatinine, Ser 0.66 0.44 - 1.00 mg/dL   Calcium 8.5 (L) 8.9 - 10.3 mg/dL   GFR, Estimated >60 >60 mL/min   Anion gap 5 5 - 15    Assessment/Plan: 1 Day Post-Op       Procedure(s): DIAGNOSTIC LAPAROSCOPY WITH REMOVAL OF ECTOPIC PREGNANCY (N/A)  -Ambulate, Incentive spirometry -tolerated advanced diet -oral pain medication -Discharge home today     LOS: 0 days    Miranda King Miranda King 07/14/2022, 3:50 PM

## 2022-07-15 LAB — TYPE AND SCREEN
ABO/RH(D): O POS
Antibody Screen: NEGATIVE
Unit division: 0
Unit division: 0
Unit division: 0
Unit division: 0
Unit division: 0

## 2022-07-15 LAB — BPAM RBC
Blood Product Expiration Date: 202404182359
Blood Product Expiration Date: 202404222359
Blood Product Expiration Date: 202405092359
Blood Product Expiration Date: 202405092359
Blood Product Expiration Date: 202405092359
ISSUE DATE / TIME: 202403302045
ISSUE DATE / TIME: 202403302045
ISSUE DATE / TIME: 202403302159
ISSUE DATE / TIME: 202403302159
ISSUE DATE / TIME: 202403302159
Unit Type and Rh: 5100
Unit Type and Rh: 5100
Unit Type and Rh: 5100
Unit Type and Rh: 9500
Unit Type and Rh: 9500

## 2022-07-16 LAB — SURGICAL PATHOLOGY

## 2022-07-16 LAB — PREPARE RBC (CROSSMATCH)

## 2022-07-16 NOTE — Anesthesia Postprocedure Evaluation (Signed)
Anesthesia Post Note  Patient: Miranda King  Procedure(s) Performed: DIAGNOSTIC LAPAROSCOPY WITH REMOVAL OF ECTOPIC PREGNANCY  Patient location during evaluation: PACU Anesthesia Type: General Level of consciousness: awake and alert Pain management: pain level controlled Vital Signs Assessment: post-procedure vital signs reviewed and stable Respiratory status: spontaneous breathing, nonlabored ventilation, respiratory function stable and patient connected to nasal cannula oxygen Cardiovascular status: blood pressure returned to baseline and stable Postop Assessment: no apparent nausea or vomiting Anesthetic complications: no   No notable events documented.   Last Vitals:  Vitals:   07/14/22 1114 07/14/22 1636  BP: (!) 109/53 (!) 101/57  Pulse: 69 78  Resp: 18 20  Temp: 37.6 C 36.8 C  SpO2:  100%    Last Pain:  Vitals:   07/14/22 1636  TempSrc: Oral  PainSc:                  Martha Clan

## 2022-12-13 ENCOUNTER — Encounter: Payer: Medicaid Other | Admitting: Obstetrics and Gynecology

## 2022-12-13 DIAGNOSIS — Z7689 Persons encountering health services in other specified circumstances: Secondary | ICD-10-CM

## 2022-12-17 ENCOUNTER — Encounter: Payer: Self-pay | Admitting: Obstetrics and Gynecology

## 2022-12-17 ENCOUNTER — Other Ambulatory Visit: Payer: Self-pay

## 2022-12-17 ENCOUNTER — Ambulatory Visit (INDEPENDENT_AMBULATORY_CARE_PROVIDER_SITE_OTHER): Payer: Medicaid Other | Admitting: Obstetrics and Gynecology

## 2022-12-17 ENCOUNTER — Other Ambulatory Visit: Payer: Self-pay | Admitting: Obstetrics and Gynecology

## 2022-12-17 ENCOUNTER — Other Ambulatory Visit (INDEPENDENT_AMBULATORY_CARE_PROVIDER_SITE_OTHER): Payer: Medicaid Other

## 2022-12-17 VITALS — BP 129/77 | HR 92 | Ht 66.0 in | Wt 161.7 lb

## 2022-12-17 DIAGNOSIS — Z3200 Encounter for pregnancy test, result unknown: Secondary | ICD-10-CM

## 2022-12-17 DIAGNOSIS — Z3689 Encounter for other specified antenatal screening: Secondary | ICD-10-CM

## 2022-12-17 DIAGNOSIS — Z7689 Persons encountering health services in other specified circumstances: Secondary | ICD-10-CM

## 2022-12-17 DIAGNOSIS — O3680X0 Pregnancy with inconclusive fetal viability, not applicable or unspecified: Secondary | ICD-10-CM

## 2022-12-17 DIAGNOSIS — Z3A22 22 weeks gestation of pregnancy: Secondary | ICD-10-CM | POA: Diagnosis not present

## 2022-12-17 DIAGNOSIS — Z3687 Encounter for antenatal screening for uncertain dates: Secondary | ICD-10-CM | POA: Diagnosis not present

## 2022-12-17 DIAGNOSIS — Z8759 Personal history of other complications of pregnancy, childbirth and the puerperium: Secondary | ICD-10-CM | POA: Diagnosis not present

## 2022-12-17 DIAGNOSIS — N912 Amenorrhea, unspecified: Secondary | ICD-10-CM

## 2022-12-17 DIAGNOSIS — Z3402 Encounter for supervision of normal first pregnancy, second trimester: Secondary | ICD-10-CM

## 2022-12-17 NOTE — Progress Notes (Signed)
Patient presents today following an ectopic pregnancy but continuing to test positive for pregnancy. She states she has not been sexually active since prior to the ectopic but has gained weight and felt "off".

## 2022-12-17 NOTE — Progress Notes (Signed)
HPI:      [name] is a 27 y.o. [phone] who LMP was Patient's last menstrual period was 07/12/2022.  Subjective:   She presents today stating that she had a positive pregnancy test recently and wants to find out if she is pregnant and how far along she is.  Her last pregnancy ended in an ectopic pregnancy in March with hemoperitoneum and blood transfusion.  She underwent salpingectomy at that time.  She reports that she has not had intercourse since January or early February.  Both of these were obviously before her ectopic.  She has not been using any birth control.She reports that she is absolutely certain about this and there is no chance she could have had sex and not known it.  She reports that she has been amenorrheic since the ectopic.    Hx: The following portions of the patient's history were reviewed and updated as appropriate:             She  has a past medical history of Asthma, Seizures (HCC), and Tobacco use disorder. She does not have any pertinent problems on file. She  has a past surgical history that includes Vaginal delivery; Dilation and evacuation (N/A, 10/03/2015); and Diagnostic laparoscopy with removal of ectopic pregnancy (N/A, 07/13/2022). Her family history includes Asthma in her sister; Diabetes in her maternal grandmother; Hypertension in her maternal grandmother; Migraines in her mother. She  reports that she has been smoking. She has never used smokeless tobacco. She reports that she does not drink alcohol and does not use drugs. She has a current medication list which includes the following prescription(s): albuterol, docusate sodium, lamotrigine, oxycodone, and simethicone. She is allergic to tramadol hcl.       Review of Systems:  Review of Systems  Constitutional: Denied constitutional symptoms, night sweats, recent illness, fatigue, fever, insomnia and weight loss.  Eyes: Denied eye symptoms, eye pain, photophobia, vision change and visual  disturbance.  Ears/Nose/Throat/Neck: Denied ear, nose, throat or neck symptoms, hearing loss, nasal discharge, sinus congestion and sore throat.  Cardiovascular: Denied cardiovascular symptoms, arrhythmia, chest pain/pressure, edema, exercise intolerance, orthopnea and palpitations.  Respiratory: Denied pulmonary symptoms, asthma, pleuritic pain, productive sputum, cough, dyspnea and wheezing.  Gastrointestinal: Denied, gastro-esophageal reflux, melena, nausea and vomiting.  Genitourinary: Denied genitourinary symptoms including symptomatic vaginal discharge, pelvic relaxation issues, and urinary complaints.  Musculoskeletal: Denied musculoskeletal symptoms, stiffness, swelling, muscle weakness and myalgia.  Dermatologic: Denied dermatology symptoms, rash and scar.  Neurologic: Denied neurology symptoms, dizziness, headache, neck pain and syncope.  Psychiatric: Denied psychiatric symptoms, anxiety and depression.  Endocrine: Denied endocrine symptoms including hot flashes and night sweats.   Meds:   Current Outpatient Medications on File Prior to Visit  Medication Sig Dispense Refill   albuterol (VENTOLIN HFA) 108 (90 Base) MCG/ACT inhaler Inhale into the lungs.     docusate sodium (COLACE) 100 MG capsule Take 1 capsule (100 mg total) by mouth 2 (two) times daily. To keep stools soft (Patient not taking: Reported on 12/17/2022) 30 capsule 0   lamoTRIgine (LAMICTAL) 25 MG tablet Week 1: 25 mg daily,Week 2: 25 mg two times a day,Week 3: 25 mg in the am, 50 mg in the pm,Week 4: 50 mg two times a day,Week 5: 50 mg in the am, 75 mg in the pm,Week 6: 75 mg two times a day, Week 7: 75 mg in the am, 100 mg in the pm,Week 8: 100 mg two times a day (Patient not taking:  Reported on 12/17/2022)     oxyCODONE (OXY IR/ROXICODONE) 5 MG immediate release tablet Take 1 tablet (5 mg total) by mouth every 4 (four) hours as needed for severe pain. (Patient not taking: Reported on 12/17/2022) 4 tablet 0   simethicone  (MYLICON) 80 MG chewable tablet Chew 1 tablet (80 mg total) by mouth 4 (four) times daily as needed for flatulence. (Patient not taking: Reported on 12/17/2022) 30 tablet 0   No current facility-administered medications on file prior to visit.      Objective:     Vitals:   12/17/22 0923  BP: 129/77  Pulse: 92   Filed Weights   12/17/22 0923  Weight: 161 lb 11.2 oz (73.3 kg)              Abdominal examination reveals uterus at umbilicus +1.  Fetal heart tones 148.          Assessment:    [id number] 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 07/14/2022   Intractable migraine with aura without status migrainosus 03/02/2019   Postpartum care following vaginal delivery 08/23/2017   Labor and delivery, indication for care 08/22/2017   Substance abuse affecting pregnancy, antepartum (HCC) 07/25/2017   Opioid use 07/24/2017   Supervision of high risk pregnancy, antepartum, third trimester 05/27/2017   Limited prenatal care in second trimester 05/27/2017   History of tattoo 05/23/2015   Routine health maintenance 05/23/2015   Seizure disorder (HCC) 10/04/2014   Asthma 09/30/2014   Tobacco use disorder 09/30/2014   Focal seizure (HCC) 09/30/2014   Dizziness 04/21/2013   Fatigue 04/21/2013   Left foot pain 04/21/2013     1. Establishing care with new doctor, encounter for   2. History of ectopic pregnancy   3. Possible pregnancy, not yet confirmed   4. Pregnancy with uncertain fetal viability, single or unspecified fetus     Second trimester pregnancy.  Patient states she has no idea when she became pregnant.  Obviously no first trimester or early second trimester prenatal care.   Plan:            1.  Advised to begin prenatal vitamins immediately  2.  Ultrasound today at 1030.  Size and dates.  3.  Schedule Rita and new OB physical. Orders Orders Placed This Encounter  Procedures   US OB Comp Less  14 Wks    No orders of the defined types were placed in this encounter.     F/U  No follow-ups on file.  Elonda Husky, M.D. 12/17/2022 9:49 AM

## 2023-01-01 ENCOUNTER — Ambulatory Visit (INDEPENDENT_AMBULATORY_CARE_PROVIDER_SITE_OTHER): Payer: Medicaid Other

## 2023-01-01 VITALS — Wt 164.0 lb

## 2023-01-01 DIAGNOSIS — Z3480 Encounter for supervision of other normal pregnancy, unspecified trimester: Secondary | ICD-10-CM | POA: Insufficient documentation

## 2023-01-01 DIAGNOSIS — Z3A24 24 weeks gestation of pregnancy: Secondary | ICD-10-CM

## 2023-01-01 DIAGNOSIS — Z3689 Encounter for other specified antenatal screening: Secondary | ICD-10-CM

## 2023-01-01 DIAGNOSIS — Z3482 Encounter for supervision of other normal pregnancy, second trimester: Secondary | ICD-10-CM

## 2023-01-01 DIAGNOSIS — Z8759 Personal history of other complications of pregnancy, childbirth and the puerperium: Secondary | ICD-10-CM

## 2023-01-01 NOTE — Patient Instructions (Addendum)
.Commonly Asked Questions During Pregnancy  Cats: A parasite can be excreted in cat feces.  To avoid exposure you need to have another person empty the little box.  If you must empty the litter box you will need to wear gloves.  Wash your hands after handling your cat.  This parasite can also be found in raw or undercooked meat so this should also be avoided.  Colds, Sore Throats, Flu: Please check your medication sheet to see what you can take for symptoms.  If your symptoms are unrelieved by these medications please call the office.  Dental Work: Most any dental work Agricultural consultant recommends is permitted.  X-rays should only be taken during the first trimester if absolutely necessary.  Your abdomen should be shielded with a lead apron during all x-rays.  Please notify your provider prior to receiving any x-rays.  Novocaine is fine; gas is not recommended.  If your dentist requires a note from [place] prior to dental work please call the office and we will provide one for you.  Exercise: Exercise is an important part of staying healthy during your pregnancy.  You may continue most exercises you were accustomed to prior to pregnancy.  Later in your pregnancy you will most likely notice you have difficulty with activities requiring balance like riding a bicycle.  It is important that you listen to your body and avoid activities that put you at a higher risk of falling.  Adequate rest and staying well hydrated are a must!  If you have questions about the safety of specific activities ask your provider.    Exposure to Children with illness: Try to avoid obvious exposure; report any symptoms to [place] when noted,  If you have chicken pos, red measles or mumps, you should be immune to these diseases.   Please do not take any vaccines while pregnant unless you have checked with your OB provider.  Fetal Movement: After 28 weeks we recommend you do "kick counts" twice daily.  Lie or sit down in a calm quiet environment  and count your baby movements "kicks".  You should feel your baby at least 10 times per hour.  If you have not felt 10 kicks within the first hour get up, walk around and have something sweet to eat or drink then repeat for an additional hour.  If count remains less than 10 per hour notify your provider.  Fumigating: Follow your pest control agent's advice as to how long to stay out of your home.  Ventilate the area well before re-entering.  Hemorrhoids:   Most over-the-counter preparations can be used during pregnancy.  Check your medication to see what is safe to use.  It is important to use a stool softener or fiber in your diet and to drink lots of liquids.  If hemorrhoids seem to be getting worse please call the office.   Hot Tubs:  Hot tubs Jacuzzis and saunas are not recommended while pregnant.  These increase your internal body temperature and should be avoided.  Intercourse:  Sexual intercourse is safe during pregnancy as long as you are comfortable, unless otherwise advised by your provider.  Spotting may occur after intercourse; report any bright red bleeding that is heavier than spotting.  Labor:  If you know that you are in labor, please go to the hospital.  If you are unsure, please call the office and let us help you decide what to do.  Lifting, straining, etc:  If your job requires heavy  lifting or straining please check with your provider for any limitations.  Generally, you should not lift items heavier than that you can lift simply with your hands and arms (no back muscles)  Painting:  Paint fumes do not harm your pregnancy, but may make you ill and should be avoided if possible.  Latex or water based paints have less odor than oils.  Use adequate ventilation while painting.  Permanents & Hair Color:  Chemicals in hair dyes are not recommended as they cause increase hair dryness which can increase hair loss during pregnancy.  " Highlighting" and permanents are allowed.  Dye may be  absorbed differently and permanents may not hold as well during pregnancy.  Sunbathing:  Use a sunscreen, as skin burns easily during pregnancy.  Drink plenty of fluids; avoid over heating.  Tanning Beds:  Because their possible side effects are still unknown, tanning beds are not recommended.  Ultrasound Scans:  Routine ultrasounds are performed at approximately 20 weeks.  You will be able to see your baby's general anatomy an if you would like to know the gender this can usually be determined as well.  If it is questionable when you conceived you may also receive an ultrasound early in your pregnancy for dating purposes.  Otherwise ultrasound exams are not routinely performed unless there is a medical necessity.  Although you can request a scan we ask that you pay for it when conducted because insurance does not cover " patient request" scans.  Work: If your pregnancy proceeds without complications you may work until your due date, unless your physician or employer advises otherwise.  Round Ligament Pain/Pelvic Discomfort:  Sharp, shooting pains not associated with bleeding are fairly common, usually occurring in the second trimester of pregnancy.  They tend to be worse when standing up or when you remain standing for long periods of time.  These are the result of pressure of certain pelvic ligaments called "round ligaments".  Rest, Tylenol and heat seem to be the most effective relief.  As the womb and fetus grow, they rise out of the pelvis and the discomfort improves.  Please notify the office if your pain seems different than that described.  It may represent a more serious condition.  Second Trimester of Pregnancy  The second trimester of pregnancy is from week 13 through week 27. This is also called months 4 through 6 of pregnancy. This is often the time when you feel your best. During the second trimester: Morning sickness is less or has stopped. You may have more energy. You may feel  hungry more often. At this time, your unborn baby (fetus) is growing very fast. At the end of the sixth month, the unborn baby may be up to 12 inches long and weigh about 1 pounds. You will likely start to feel the baby move between 16 and 20 weeks of pregnancy. Body changes during your second trimester Your body continues to go through many changes during this time. The changes vary and generally return to normal after the baby is born. Physical changes You will gain more weight. You may start to get stretch marks on your hips, belly (abdomen), and breasts. Your breasts will grow and may hurt. Dark spots or blotches may develop on your face. A dark line from your belly button to the pubic area (linea nigra) may appear. You may have changes in your hair. Health changes You may have headaches. You may have heartburn. You may have trouble pooping (constipation).  You may have hemorrhoids or swollen, bulging veins (varicose veins). Your gums may bleed. You may pee (urinate) more often. You may have back pain. Follow these instructions at home: Medicines Take over-the-counter and prescription medicines only as told by your doctor. Some medicines are not safe during pregnancy. Take a prenatal vitamin that contains at least 600 micrograms (mcg) of folic acid. Eating and drinking Eat healthy meals that include: Fresh fruits and vegetables. Whole grains. Good sources of protein, such as meat, eggs, or tofu. Low-fat dairy products. Avoid raw meat and unpasteurized juice, milk, and cheese. You may need to take these actions to prevent or treat trouble pooping: Drink enough fluids to keep your pee (urine) pale yellow. Eat foods that are high in fiber. These include beans, whole grains, and fresh fruits and vegetables. Limit foods that are high in fat and sugar. These include fried or sweet foods. Activity Exercise only as told by your doctor. Most people can do their usual exercise during  pregnancy. Try to exercise for 30 minutes at least 5 days a week. Stop exercising if you have pain or cramps in your belly or lower back. Do not exercise if it is too hot or too humid, or if you are in a place of great height (high altitude). Avoid heavy lifting. If you choose to, you may have sex unless your doctor tells you not to. Relieving pain and discomfort Wear a good support bra if your breasts are sore. Take warm water baths (sitz baths) to soothe pain or discomfort caused by hemorrhoids. Use hemorrhoid cream if your doctor approves. Rest with your legs raised (elevated) if you have leg cramps or low back pain. If you develop bulging veins in your legs: Wear support hose as told by your doctor. Raise your feet for 15 minutes, 3-4 times a day. Limit salt in your food. Safety Wear your seat belt at all times when you are in a car. Talk with your doctor if someone is hurting you or yelling at you a lot. Lifestyle Do not use hot tubs, steam rooms, or saunas. Do not douche. Do not use tampons or scented sanitary pads. Avoid cat litter boxes and soil used by cats. These carry germs that can harm your baby and can cause a loss of your baby by miscarriage or stillbirth. Do not use herbal medicines, illegal drugs, or medicines that are not approved by your doctor. Do not drink alcohol. Do not smoke or use any products that contain nicotine or tobacco. If you need help quitting, ask your doctor. General instructions Keep all follow-up visits. This is important. Ask your doctor about local prenatal classes. Ask your doctor about the right foods to eat or for help finding a counselor. Where to find more information American Pregnancy Association: americanpregnancy.org [employer]: www.acog.org Office on [employer] Health: [employer] Contact a doctor if: You have a headache that does not go away when you take medicine. You have changes  in how you see, or you see spots in front of your eyes. You have mild cramps, pressure, or pain in your lower belly. You continue to feel like you may vomit (nauseous), you vomit, or you have watery poop (diarrhea). You have bad-smelling fluid coming from your vagina. You have pain when you pee or your pee smells bad. You have very bad swelling of your face, hands, ankles, feet, or legs. You have a fever. Get help right away if: You are leaking fluid from your  vagina. You have spotting or bleeding from your vagina. You have very bad belly cramping or pain. You have trouble breathing. You have chest pain. You faint. You have not felt your baby move for the time period told by your doctor. You have new or increased pain, swelling, or redness in an arm or leg. Summary The second trimester of pregnancy is from week 13 through week 27 (months 4 through 6). Eat healthy meals. Exercise as told by your doctor. Most people can do their usual exercise during pregnancy. Do not use herbal medicines, illegal drugs, or medicines that are not approved by your doctor. Do not drink alcohol. Call your doctor if you get sick or if you notice anything unusual about your pregnancy. This information is not intended to replace advice given to you by your health care provider. Make sure you discuss any questions you have with your health care provider. Document Revised: 09/08/2019 Document Reviewed: 07/15/2019 Elsevier Patient Education  2024 Elsevier Inc. Common Medications Safe in Pregnancy  Acne:      Constipation:  Benzoyl Peroxide     Colace  Clindamycin      Dulcolax Suppository  Topica Erythromycin     Fibercon  Salicylic Acid      Metamucil         Miralax AVOID:        Senakot   Accutane    Cough:  Retin-A       Cough Drops  Tetracycline      Phenergan w/ Codeine if Rx  Minocycline      Robitussin (Plain &  DM)  Antibiotics:     Crabs/Lice:  Ceclor       RID  Cephalosporins    AVOID:  E-Mycins      Kwell  Keflex  Macrobid/Macrodantin   Diarrhea:  Penicillin      Kao-Pectate  Zithromax      Imodium AD         PUSH FLUIDS AVOID:       Cipro     Fever:  Tetracycline      Tylenol (Regular or Extra  Minocycline       Strength)  Levaquin      Extra Strength-Do not          Exceed 8 tabs/24 hrs Caffeine:        200mg /day (equiv. To 1 cup of coffee or  approx. 3 12 oz sodas)         Gas: Cold/Hayfever:       Gas-X  Benadryl      Mylicon  Claritin       Phazyme  **Claritin-D        Chlor-Trimeton    Headaches:  Dimetapp      ASA-Free Excedrin  Drixoral-Non-Drowsy     Cold Compress  Mucinex (Guaifenasin)     Tylenol (Regular or Extra  Sudafed/Sudafed-12 Hour     Strength)  **Sudafed PE Pseudoephedrine   Tylenol Cold & Sinus     Vicks Vapor Rub  Zyrtec  **AVOID if Problems With Blood Pressure         Heartburn: Avoid lying down for at least 1 hour after meals  Aciphex      Maalox     Rash:  Milk of Magnesia     Benadryl    Mylanta       1% Hydrocortisone Cream  Pepcid  Pepcid Complete   Sleep Aids:  Prevacid      Ambien  Prilosec       Benadryl  Rolaids       Chamomile Tea  Tums (Limit 4/day)     Unisom         Tylenol PM         Warm milk-add vanilla or  Hemorrhoids:       Sugar for taste  Anusol/Anusol H.C.  (RX: Analapram 2.5%)  Sugar Substitutes:  Hydrocortisone OTC     Ok in moderation  Preparation H      Tucks        Vaseline lotion applied to tissue with wiping    Herpes:     Throat:  Acyclovir      Oragel  Famvir  Valtrex     Vaccines:         Flu Shot Leg Cramps:       *Gardasil  Benadryl      Hepatitis A         Hepatitis B Nasal Spray:       Pneumovax  Saline Nasal Spray     Polio Booster         Tetanus Nausea:       Tuberculosis test or PPD  Vitamin B6 25 mg TID   AVOID:    Dramamine      *Gardasil  Emetrol       Live Poliovirus  Ginger  Root 250 mg QID    MMR (measles, mumps &  High Complex Carbs @ Bedtime    rebella)  Sea Bands-Accupressure    Varicella (Chickenpox)  Unisom 1/2 tab TID     *No known complications           If received before Pain:         Known pregnancy;   Darvocet       Resume series after  Lortab        Delivery  Percocet    Yeast:   Tramadol      Femstat  Tylenol 3      Gyne-lotrimin  Ultram       Monistat  Vicodin           MISC:         All Sunscreens           Hair Coloring/highlights          Insect Repellant's          (Including DEET)         Mystic Tans

## 2023-01-01 NOTE — Progress Notes (Signed)
New OB Intake I discussed the limitations, risks, security and privacy concerns of performing an evaluation and management service by telephone and the availability of in person appointments. I also discussed with the patient that there may be a patient responsible charge related to this service. The patient expressed understanding and agreed to proceed.  I explained I am completing New OB Intake today. We discussed her EDD of 04/21/23 that is based on first trimester ultrasound. Pt is G4/P2012. I reviewed her allergies, medications, Medical/Surgical/OB history, and appropriate screenings. There are cats in the home in the home  no. Based on history, this is a/an pregnancy uncomplicated .   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 07/14/2022   Intractable migraine with aura without status migrainosus 03/02/2019   Postpartum care following vaginal delivery 08/23/2017   Labor and delivery, indication for care 08/22/2017   Substance abuse affecting pregnancy, antepartum (HCC) 07/25/2017   Opioid use 07/24/2017   Supervision of high risk pregnancy, antepartum, third trimester 05/27/2017   Limited prenatal care in second trimester 05/27/2017   History of tattoo 05/23/2015   Routine health maintenance 05/23/2015   Seizure disorder (HCC) 10/04/2014   Asthma 09/30/2014   Tobacco use disorder 09/30/2014   Focal seizure (HCC) 09/30/2014   Dizziness 04/21/2013   Fatigue 04/21/2013   Left foot pain 04/21/2013    Concerns addressed today  Delivery Plans:  Plans to deliver at [facility]  Anatomy US  Anatomy US scheduled for 01/06/23.   Labs Discussed Avelina Laine genetic screening with patient. Patient does want genetic testing to be drawn at new OB visit. Discussed possible labs to be drawn at new OB appointment.  COVID Vaccine Patient has not had COVID vaccine.   Social Determinants of Health Food Insecurity:  denies food insecurity WIC Referral: Patient is interested in referral to [facility].  Transportation: Patient denies transportation needs. Childcare: Discussed no children allowed at ultrasound appointments.   First visit review I reviewed new OB appt with pt. I explained she will have ob bloodwork and pap smear/pelvic exam if indicated. Explained pt will be seen by Doreene Burke, CNM at first visit; encounter routed to appropriate provider.   Loman Chroman, CMA 01/01/2023  8:37 AM

## 2023-01-02 ENCOUNTER — Ambulatory Visit (INDEPENDENT_AMBULATORY_CARE_PROVIDER_SITE_OTHER): Payer: Medicaid Other | Admitting: Certified Nurse Midwife

## 2023-01-02 ENCOUNTER — Encounter: Payer: Self-pay | Admitting: Certified Nurse Midwife

## 2023-01-02 ENCOUNTER — Other Ambulatory Visit (HOSPITAL_COMMUNITY)
Admission: RE | Admit: 2023-01-02 | Discharge: 2023-01-02 | Disposition: A | Discharge status: Home / Self Care | Payer: Medicaid Other | Source: Ambulatory Visit | Attending: Certified Nurse Midwife | Admitting: Certified Nurse Midwife

## 2023-01-02 VITALS — BP 115/72 | HR 88 | Wt 166.8 lb

## 2023-01-02 DIAGNOSIS — Z3A24 24 weeks gestation of pregnancy: Secondary | ICD-10-CM

## 2023-01-02 DIAGNOSIS — Z0184 Encounter for antibody response examination: Secondary | ICD-10-CM

## 2023-01-02 DIAGNOSIS — Z124 Encounter for screening for malignant neoplasm of cervix: Secondary | ICD-10-CM | POA: Insufficient documentation

## 2023-01-02 DIAGNOSIS — Z3482 Encounter for supervision of other normal pregnancy, second trimester: Secondary | ICD-10-CM | POA: Diagnosis not present

## 2023-01-02 DIAGNOSIS — Z1379 Encounter for other screening for genetic and chromosomal anomalies: Secondary | ICD-10-CM

## 2023-01-02 DIAGNOSIS — T7589XA Other specified effects of external causes, initial encounter: Secondary | ICD-10-CM

## 2023-01-02 DIAGNOSIS — Z130 Encounter for screening for diseases of the blood and blood-forming organs and certain disorders involving the immune mechanism: Secondary | ICD-10-CM

## 2023-01-02 DIAGNOSIS — Z113 Encounter for screening for infections with a predominantly sexual mode of transmission: Secondary | ICD-10-CM

## 2023-01-02 DIAGNOSIS — Z0283 Encounter for blood-alcohol and blood-drug test: Secondary | ICD-10-CM

## 2023-01-02 NOTE — Addendum Note (Signed)
Addended by: Fonda Kinder on: 01/02/2023 01:48 PM   Modules accepted: Orders

## 2023-01-02 NOTE — Patient Instructions (Signed)
Oral Glucose Tolerance Test During Pregnancy Why am I having this test? The oral glucose tolerance test (OGTT) is done to check how your body processes blood sugar (glucose). This is one of several tests used to diagnose diabetes that develops during pregnancy (gestational diabetes mellitus). Gestational diabetes is a short-term form of diabetes that some women develop while they are pregnant. It usually occurs during the second trimester of pregnancy and goes away after delivery. Testing, or screening, for gestational diabetes usually occurs at weeks 24-28 of pregnancy. You may have the OGTT test after having a 1-hour glucose screening test if the results from that test indicate that you may have gestational diabetes. This test may also be needed if: You have a history of gestational diabetes. There is a history of giving birth to very large babies or of losing pregnancies (having stillbirths). You have signs and symptoms of diabetes, such as: Changes in your eyesight. Tingling or numbness in your hands or feet. Changes in hunger, thirst, and urination, and these are not explained by your pregnancy. What is being tested? This test measures the amount of glucose in your blood at different times during a period of 3 hours. This shows how well your body can process glucose. What kind of sample is taken?  Blood samples are required for this test. They are usually collected by inserting a needle into a blood vessel. How do I prepare for this test? For 3 days before your test, eat normally. Have plenty of carbohydrate-rich foods. Follow instructions from your health care provider about: Eating or drinking restrictions on the day of the test. You may be asked not to eat or drink anything other than water (to fast) starting 8-10 hours before the test. Changing or stopping your regular medicines. Some medicines may interfere with this test. Tell a health care provider about: All medicines you are  taking, including vitamins, herbs, eye drops, creams, and over-the-counter medicines. Any blood disorders you have. Any surgeries you have had. Any medical conditions you have. What happens during the test? First, your blood glucose will be measured. This is referred to as your fasting blood glucose because you fasted before the test. Then, you will drink a glucose solution that contains a certain amount of glucose. Your blood glucose will be measured again 1, 2, and 3 hours after you drink the solution. This test takes about 3 hours to complete. You will need to stay at the testing location during this time. During the testing period: Do not eat or drink anything other than the glucose solution. Do not exercise. Do not use any products that contain nicotine or tobacco, such as cigarettes, e-cigarettes, and chewing tobacco. These can affect your test results. If you need help quitting, ask your health care provider. The testing procedure may vary among health care providers and hospitals. How are the results reported? Your results will be reported as milligrams of glucose per deciliter of blood (mg/dL) or millimoles per liter (mmol/L). There is more than one source for screening and diagnosis reference values used to diagnose gestational diabetes. Your health care provider will compare your results to normal values that were established after testing a large group of people (reference values). Reference values may vary among labs and hospitals. For this test (Carpenter-Coustan), reference values are: Fasting: 95 mg/dL (5.3 mmol/L). 1 hour: 180 mg/dL (10.0 mmol/L). 2 hour: 155 mg/dL (8.6 mmol/L). 3 hour: 140 mg/dL (7.8 mmol/L). What do the results mean? Results below the reference values are   considered normal. If two or more of your blood glucose levels are at or above the reference values, you may be diagnosed with gestational diabetes. If only one level is high, your health care provider may  suggest repeat testing or other tests to confirm a diagnosis. Talk with your health care provider about what your results mean. Questions to ask your health care provider Ask your health care provider, or the department that is doing the test: When will my results be ready? How will I get my results? What are my treatment options? What other tests do I need? What are my next steps? Summary The oral glucose tolerance test (OGTT) is one of several tests used to diagnose diabetes that develops during pregnancy (gestational diabetes mellitus). Gestational diabetes is a short-term form of diabetes that some women develop while they are pregnant. You may have the OGTT test after having a 1-hour glucose screening test if the results from that test show that you may have gestational diabetes. You may also have this test if you have any symptoms or risk factors for this type of diabetes. Talk with your health care provider about what your results mean. This information is not intended to replace advice given to you by your health care provider. Make sure you discuss any questions you have with your health care provider. Document Revised: 11/06/2021 Document Reviewed: 09/09/2019 Elsevier Patient Education  2024 Elsevier Inc.  

## 2023-01-02 NOTE — Progress Notes (Signed)
NEW OB HISTORY AND PHYSICAL  SUBJECTIVE:       Miranda King is a 27 y.o. [phone] female, Patient's last menstrual period was 07/12/2022., Estimated Date of Delivery: 04/21/23, [redacted]w[redacted]d, presents today for establishment of Prenatal Care. She has no unusual complaints.   Relationship: FOB is friend Earleen Reaper Living: with her 2 other children Work :  Exercise : regular , walking squats Alcohol: denies  Drugs denies  Smoking/Vaping:  smoke 3 cig a day    Gynecologic History Patient's last menstrual period was 07/12/2022. Normal Contraception: none Last Pap: 06/27/2018. Results were: ASCUS /+ HPV ( did not do colpo)  Obstetric History OB History  Gravida Para Term Preterm AB Living  4 2 2   1 2   SAB IAB Ectopic Multiple Live Births    0 1 0 2    # Outcome Date GA Lbr Len/2nd Weight Sex Type Anes PTL Lv  4 Current           3 Ectopic 07/14/22     ECTOPIC     2 Term 08/23/17 [redacted]w[redacted]d / 00:23 7 lb 0.5 oz (3.19 kg) M Vag-Spont EPI  LIV     Birth Comments: No anomalies noted at delivery  1 Term 07/05/12 [redacted]w[redacted]d  6 lb 9 oz (2.977 kg) F Vag-Spont   LIV    Past Medical History:  Diagnosis Date   Asthma    Seizures (HCC)    Tobacco use disorder     Past Surgical History:  Procedure Laterality Date   DIAGNOSTIC LAPAROSCOPY WITH REMOVAL OF ECTOPIC PREGNANCY N/A 07/13/2022   Procedure: DIAGNOSTIC LAPAROSCOPY WITH REMOVAL OF ECTOPIC PREGNANCY;  Surgeon: Christeen Douglas, MD;  Location: [facility] ORS;  Service: Gynecology;  Laterality: N/A;   DILATION AND EVACUATION N/A 10/03/2015   Procedure: DILATATION AND EVACUATION;  Surgeon: Nadara Mustard, MD;  Location: [facility] ORS;  Service: Gynecology;  Laterality: N/A;   VAGINAL DELIVERY      Current Outpatient Medications on File Prior to Visit  Medication Sig Dispense Refill   albuterol (VENTOLIN HFA) 108 (90 Base) MCG/ACT inhaler Inhale into the lungs.     Prenatal Vit-Fe Fumarate-FA (MULTIVITAMIN-PRENATAL) 27-0.8 MG TABS tablet Take 1 tablet by  mouth daily at 12 noon.     No current facility-administered medications on file prior to visit.    Allergies  Allergen Reactions   Coconut Flavor    Tramadol Hcl Nausea And Vomiting    Social History   Socioeconomic History   Marital status: Single    Spouse name: Not on file   Number of children: Not on file   Years of education: Not on file   Highest education level: Not on file  Occupational History   Not on file  Tobacco Use   Smoking status: Every Day    Types: Cigarettes   Smokeless tobacco: Never   Tobacco comments:    Had a cigarette prior to arriving at the hospital.   Vaping Use   Vaping status: Never Used  Substance and Sexual Activity   Alcohol use: No    Alcohol/week: 0.0 standard drinks of alcohol   Drug use: Yes    Types: Marijuana   Sexual activity: Not Currently    Birth control/protection: None  Other Topics Concern   Not on file  Social History Narrative   Not on file   Social Determinants of Health   Financial Resource Strain: Low Risk  (04/17/2022)   Received from [facility], [facility]   Overall Financial Resource Strain (CARDIA)    Difficulty of Paying Living Expenses: Not hard at all  Food Insecurity: No Food Insecurity (07/14/2022)   Hunger Vital Sign    Worried About Running Out of Food in the Last Year: Never true    Ran Out of Food in the Last Year: Never true  Transportation Needs: No Transportation Needs (07/14/2022)   PRAPARE - Administrator, Civil Service (Medical): No    Lack of Transportation (Non-Medical): No  Physical Activity: Not on file  Stress: Not on file  Social Connections: Not on file  Intimate Partner Violence: Not At Risk (07/14/2022)   Humiliation, Afraid, Rape, and Kick questionnaire    Fear of Current or Ex-Partner: No    Emotionally Abused: No    Physically Abused: No    Sexually Abused: No    Family History  Problem Relation Age of Onset   Migraines  Mother    Asthma Sister    Hypertension Maternal Grandmother    Diabetes Maternal Grandmother     The following portions of the patient's history were reviewed and updated as appropriate: allergies, current medications, past OB history, past medical history, past surgical history, past family history, past social history, and problem list.    OBJECTIVE: Initial Physical Exam (New OB)  GENERAL APPEARANCE: alert, well appearing, in no apparent distress, oriented to person, place and time HEAD: normocephalic, atraumatic MOUTH: mucous membranes moist, pharynx normal without lesions THYROID: no thyromegaly or masses present BREASTS: no masses noted, no significant tenderness, no palpable axillary nodes, no skin changes LUNGS: clear to auscultation, no wheezes, rales or rhonchi, symmetric air entry HEART: regular rate and rhythm, no murmurs ABDOMEN: soft, nontender, nondistended, no abnormal masses, no epigastric pain, fundus soft, nontender 24 weeks size, and FHT present EXTREMITIES: no redness or tenderness in the calves or thighs, no edema, no limitation in range of motion, intact peripheral pulses SKIN: normal coloration and turgor, no rashes LYMPH NODES: no adenopathy palpable NEUROLOGIC: alert, oriented, normal speech, no focal findings or movement disorder noted  PELVIC EXAM EXTERNAL GENITALIA: normal appearing vulva with no masses, tenderness or lesions VAGINA: no abnormal discharge or lesions CERVIX: no lesions or cervical motion tenderness, pap collected  UTERUS: gravid ADNEXA: no masses palpable and nontender OB EXAM PELVIMETRY: appears adequate RECTUM: exam not indicated  ASSESSMENT: Normal pregnancy  PLAN: Prenatal care See ordersNew OB counseling: The patient has been given an overview regarding routine prenatal care. Recommendations regarding diet, weight gain, and exercise in pregnancy were given. Prenatal testing, optional genetic testing, carrier screening,  and  ultrasound use in pregnancy were reviewed.  Benefits of Breast Feeding were discussed. The patient is encouraged to consider nursing her baby post partum. Glucose screen next visit. Reviewed with pt. Follow up 4 wks.  Pt has u/s scheduled for anatomy 01/06/23.   Doreene Burke, CNM

## 2023-01-03 LAB — CBC/D/PLT+RPR+RH+ABO+RUBIGG...
Antibody Screen: NEGATIVE
Basophils Absolute: 0 10*3/uL (ref 0.0–0.2)
Basos: 1 %
EOS (ABSOLUTE): 0.1 10*3/uL (ref 0.0–0.4)
Eos: 1 %
HCV Ab: NONREACTIVE
HIV Screen 4th Generation wRfx: NONREACTIVE
Hematocrit: 34.8 % (ref 34.0–46.6)
Hemoglobin: 11.5 g/dL (ref 11.1–15.9)
Hepatitis B Surface Ag: NEGATIVE
Immature Grans (Abs): 0.1 10*3/uL (ref 0.0–0.1)
Immature Granulocytes: 1 %
Lymphocytes Absolute: 2 10*3/uL (ref 0.7–3.1)
Lymphs: 23 %
MCH: 29.7 pg (ref 26.6–33.0)
MCHC: 33 g/dL (ref 31.5–35.7)
MCV: 90 fL (ref 79–97)
Monocytes Absolute: 0.5 10*3/uL (ref 0.1–0.9)
Monocytes: 6 %
Neutrophils Absolute: 5.9 10*3/uL (ref 1.4–7.0)
Neutrophils: 68 %
Platelets: 312 10*3/uL (ref 150–450)
RBC: 3.87 x10E6/uL (ref 3.77–5.28)
RDW: 14.4 % (ref 11.7–15.4)
RPR Ser Ql: NONREACTIVE
Rh Factor: POSITIVE
Rubella Antibodies, IGG: 1.21 index (ref >0.99)
Varicella zoster IgG: 602 index (ref >165)
WBC: 8.6 10*3/uL (ref 3.4–10.8)

## 2023-01-03 LAB — HCV INTERPRETATION

## 2023-01-06 ENCOUNTER — Ambulatory Visit
Admission: RE | Admit: 2023-01-06 | Discharge: 2023-01-06 | Disposition: A | Discharge status: Home / Self Care | Payer: Medicaid Other | Source: Ambulatory Visit | Attending: Obstetrics and Gynecology | Admitting: Obstetrics and Gynecology

## 2023-01-06 DIAGNOSIS — Z3689 Encounter for other specified antenatal screening: Secondary | ICD-10-CM | POA: Diagnosis present

## 2023-01-06 DIAGNOSIS — Z3402 Encounter for supervision of normal first pregnancy, second trimester: Secondary | ICD-10-CM | POA: Insufficient documentation

## 2023-01-07 LAB — CANNABINOID (GC/MS), URINE
Cannabinoid: POSITIVE — AB
Carboxy THC (GC/MS): 126 ng/mL

## 2023-01-07 LAB — MONITOR DRUG PROFILE 14(MW)
Amphetamine Scrn, Ur: NEGATIVE ng/mL
BARBITURATE SCREEN URINE: NEGATIVE ng/mL
BENZODIAZEPINE SCREEN, URINE: NEGATIVE ng/mL
Buprenorphine, Urine: NEGATIVE ng/mL
Cocaine (Metab) Scrn, Ur: NEGATIVE ng/mL
Creatinine(Crt), U: 165.5 mg/dL (ref 20.0–300.0)
Fentanyl, Urine: NEGATIVE pg/mL
Meperidine Screen, Urine: NEGATIVE ng/mL
Methadone Screen, Urine: NEGATIVE ng/mL
OXYCODONE+OXYMORPHONE UR QL SCN: NEGATIVE ng/mL
Opiate Scrn, Ur: NEGATIVE ng/mL
Ph of Urine: 6.8 (ref 4.5–8.9)
Phencyclidine Qn, Ur: NEGATIVE ng/mL
Propoxyphene Scrn, Ur: NEGATIVE ng/mL
SPECIFIC GRAVITY: 1.023
Tramadol Screen, Urine: NEGATIVE ng/mL

## 2023-01-07 LAB — NICOTINE SCREEN, URINE: Cotinine Ql Scrn, Ur: POSITIVE ng/mL — AB

## 2023-01-08 LAB — MATERNIT 21 PLUS CORE, BLOOD
Fetal Fraction: 18
Result (T21): NEGATIVE
Trisomy 13 (Patau syndrome): NEGATIVE
Trisomy 18 (Edwards syndrome): NEGATIVE
Trisomy 21 (Down syndrome): NEGATIVE

## 2023-01-10 LAB — CYTOLOGY - PAP
Chlamydia: NEGATIVE
Comment: NEGATIVE
Comment: NEGATIVE
Comment: NEGATIVE
Comment: NEGATIVE
Comment: NORMAL
Diagnosis: UNDETERMINED — AB
HPV 16: NEGATIVE
HPV 18 / 45: NEGATIVE
High risk HPV: POSITIVE — AB
Neisseria Gonorrhea: NEGATIVE

## 2023-01-13 ENCOUNTER — Encounter: Payer: Self-pay | Admitting: Certified Nurse Midwife

## 2023-01-15 ENCOUNTER — Telehealth: Payer: Self-pay

## 2023-01-15 NOTE — Telephone Encounter (Signed)
TRIAGE VOICEMAIL: Patient reports she is in her second trimester and is having sharp pains in her right lower abdomen. She states they go away. Inquiring if normal/what to do?

## 2023-01-15 NOTE — Telephone Encounter (Signed)
Mailbox full. Unable to leave message. Will send mychart message.

## 2023-01-23 ENCOUNTER — Other Ambulatory Visit: Payer: Self-pay

## 2023-01-23 DIAGNOSIS — Z131 Encounter for screening for diabetes mellitus: Secondary | ICD-10-CM

## 2023-01-23 DIAGNOSIS — Z113 Encounter for screening for infections with a predominantly sexual mode of transmission: Secondary | ICD-10-CM

## 2023-01-23 DIAGNOSIS — D649 Anemia, unspecified: Secondary | ICD-10-CM

## 2023-01-30 ENCOUNTER — Encounter: Payer: Self-pay | Admitting: Advanced Practice Midwife

## 2023-01-30 ENCOUNTER — Ambulatory Visit (INDEPENDENT_AMBULATORY_CARE_PROVIDER_SITE_OTHER): Payer: Medicaid Other | Admitting: Advanced Practice Midwife

## 2023-01-30 ENCOUNTER — Other Ambulatory Visit: Payer: Medicaid Other

## 2023-01-30 VITALS — BP 126/73 | HR 89

## 2023-01-30 DIAGNOSIS — Z3480 Encounter for supervision of other normal pregnancy, unspecified trimester: Secondary | ICD-10-CM

## 2023-01-30 DIAGNOSIS — Z3A28 28 weeks gestation of pregnancy: Secondary | ICD-10-CM

## 2023-01-30 DIAGNOSIS — Z3483 Encounter for supervision of other normal pregnancy, third trimester: Secondary | ICD-10-CM

## 2023-01-30 DIAGNOSIS — Z113 Encounter for screening for infections with a predominantly sexual mode of transmission: Secondary | ICD-10-CM

## 2023-01-30 DIAGNOSIS — D649 Anemia, unspecified: Secondary | ICD-10-CM

## 2023-01-30 DIAGNOSIS — Z131 Encounter for screening for diabetes mellitus: Secondary | ICD-10-CM

## 2023-01-30 LAB — POCT URINALYSIS DIPSTICK OB
Bilirubin, UA: NEGATIVE
Blood, UA: NEGATIVE
Glucose, UA: NEGATIVE
Ketones, UA: NEGATIVE
Leukocytes, UA: NEGATIVE
Nitrite, UA: NEGATIVE
Spec Grav, UA: 1.01 (ref 1.010–1.025)
Urobilinogen, UA: 0.2 U/dL
pH, UA: 7 (ref 5.0–8.0)

## 2023-01-30 NOTE — Progress Notes (Addendum)
ROB [redacted]w[redacted]d: She is doing well. She reports good fetal movement. TDAP/BTC form done today. She has no new concerns today.

## 2023-01-30 NOTE — Addendum Note (Signed)
Addended by: Tommie Raymond on: 01/30/2023 11:13 AM   Modules accepted: Orders

## 2023-01-30 NOTE — Progress Notes (Signed)
Routine Prenatal Care Visit  Subjective  Miranda King is a 27 y.o. [id number] at [redacted]w[redacted]d being seen today for ongoing prenatal care.  She is currently monitored for the following issues for this low-risk pregnancy and has Asthma; Tobacco use disorder; Seizure disorder (HCC); History of tattoo; Ruptured left tubal ectopic pregnancy causing hemoperitoneum; and Supervision of other normal pregnancy, antepartum on their problem list.  ----------------------------------------------------------------------------------- Patient reports no complaints.  28w labs today. Contractions: Not present. Vag. Bleeding: None.  Movement: Present. Leaking Fluid denies.  ----------------------------------------------------------------------------------- The following portions of the patient's history were reviewed and updated as appropriate: allergies, current medications, past family history, past medical history, past social history, past surgical history and problem list. Problem list updated.  Objective  Blood pressure 126/73, pulse 89, last menstrual period 07/12/2022. Pregravid weight 135 lb (61.2 kg) Total Weight Gain 31 lb 12.8 oz (14.4 kg) Urinalysis: Urine Protein    Urine Glucose    Fetal Status: Fetal Heart Rate (bpm): 137 Fundal Height: 28 cm Movement: Present     General:  Alert, oriented and cooperative. Patient is in no acute distress.  Skin: Skin is warm and dry. No rash noted.   Cardiovascular: Normal heart rate noted  Respiratory: Normal respiratory effort, no problems with respiration noted  Abdomen: Soft, gravid, appropriate for gestational age. Pain/Pressure: Absent     Pelvic:  Cervical exam deferred        Extremities: Normal range of motion.  Edema: None  Mental Status: Normal mood and affect. Normal behavior. Normal judgment and thought content.   Assessment   27 y.o. [id number] at [redacted]w[redacted]d by  04/21/2023, by Ultrasound presenting for routine prenatal visit  Plan   fifth Problems (from  12/17/22 to present)     Problem Noted Resolved   Supervision of other normal pregnancy, antepartum 01/01/2023 by Loman Chroman, CMA No   Overview Addendum 01/29/2023  4:26 PM by Tommie Raymond, CMA     Clinical Staff Provider  Office Location  [facility] Dating  04/21/2023, by Ultrasound  Language  English Anatomy US   Normal  Flu Vaccine   Genetic Screen  NIPS: Neg/Female: 01/02/23  TDaP vaccine    Hgb A1C or  GTT Early : Third trimester :   Covid no   LAB RESULTS   Rhogam  O/Positive/-- (09/19 1040)  Blood Type O/Positive/-- (09/19 1040)   RSV  Antibody Negative (09/19 1040)  Feeding Plan breast Rubella 1.21 (09/19 1040)  Contraception  RPR Non Reactive (09/19 1040)   Circumcision  HBsAg Negative (09/19 1040)   Pediatrician   HIV Non Reactive (09/19 1040)  Support Person Molly Maduro Varicella 602 (09/19 1040)  Prenatal Classes online GBS  (For PCN allergy, check sensitivities)     Hep C Non Reactive (09/19 1040)   BTL Consent  Pap Diagnosis  Date Value Ref Range Status  01/02/2023 (A)  Final   - Atypical squamous cells of undetermined significance (ASC-US)    VBAC Consent N/a Hgb Electro      CF      SMA                    Preterm labor symptoms and general obstetric precautions including but not limited to vaginal bleeding, contractions, leaking of fluid and fetal movement were reviewed in detail with the patient. Please refer to After Visit Summary for other counseling recommendations.   Return in about 2 weeks (around 02/13/2023) for rob.  Tresea Mall, CNM 01/30/2023  10:00 AM

## 2023-01-31 LAB — 28 WEEK RH+PANEL
Basophils Absolute: 0 10*3/uL (ref 0.0–0.2)
Basos: 0 %
EOS (ABSOLUTE): 0.1 10*3/uL (ref 0.0–0.4)
Eos: 1 %
Gestational Diabetes Screen: 89 mg/dL (ref 70–139)
HIV Screen 4th Generation wRfx: NONREACTIVE
Hematocrit: 33.8 % — ABNORMAL LOW (ref 34.0–46.6)
Hemoglobin: 11.1 g/dL (ref 11.1–15.9)
Immature Grans (Abs): 0 10*3/uL (ref 0.0–0.1)
Immature Granulocytes: 0 %
Lymphocytes Absolute: 2 10*3/uL (ref 0.7–3.1)
Lymphs: 22 %
MCH: 29.8 pg (ref 26.6–33.0)
MCHC: 32.8 g/dL (ref 31.5–35.7)
MCV: 91 fL (ref 79–97)
Monocytes Absolute: 0.6 10*3/uL (ref 0.1–0.9)
Monocytes: 6 %
Neutrophils Absolute: 6.6 10*3/uL (ref 1.4–7.0)
Neutrophils: 71 %
Platelets: 304 10*3/uL (ref 150–450)
RBC: 3.72 x10E6/uL — ABNORMAL LOW (ref 3.77–5.28)
RDW: 14.1 % (ref 11.7–15.4)
RPR Ser Ql: NONREACTIVE
WBC: 9.3 10*3/uL (ref 3.4–10.8)

## 2023-02-13 ENCOUNTER — Telehealth: Payer: Self-pay

## 2023-02-13 DIAGNOSIS — R87610 Atypical squamous cells of undetermined significance on cytologic smear of cervix (ASC-US): Secondary | ICD-10-CM | POA: Insufficient documentation

## 2023-02-13 DIAGNOSIS — R87810 Cervical high risk human papillomavirus (HPV) DNA test positive: Secondary | ICD-10-CM | POA: Insufficient documentation

## 2023-02-13 NOTE — Telephone Encounter (Signed)
Reached out to pt to reschedule ROB appt that was scheduled on 02/13/2023 at 9:55 with [facility].  Could not leave a message for pt bc mailbox was full.

## 2023-02-14 NOTE — Telephone Encounter (Signed)
Reached out to pt (2x) to reschedule ROB appt that was scheduled on 02/13/2023 at 9:55 with [facility].  Did not reach pt, but someone did answer the phone.  Left a message with person who answered the phone for pt to call back.

## 2023-02-25 ENCOUNTER — Ambulatory Visit: Payer: Medicaid Other | Admitting: Obstetrics & Gynecology

## 2023-02-25 VITALS — BP 106/55 | HR 97 | Wt 176.0 lb

## 2023-02-25 DIAGNOSIS — Z3009 Encounter for other general counseling and advice on contraception: Secondary | ICD-10-CM

## 2023-02-25 DIAGNOSIS — Z3480 Encounter for supervision of other normal pregnancy, unspecified trimester: Secondary | ICD-10-CM

## 2023-02-25 DIAGNOSIS — Z3493 Encounter for supervision of normal pregnancy, unspecified, third trimester: Secondary | ICD-10-CM

## 2023-02-25 DIAGNOSIS — Z3A32 32 weeks gestation of pregnancy: Secondary | ICD-10-CM

## 2023-02-25 NOTE — Progress Notes (Signed)
   PRENATAL VISIT NOTE  Subjective:  Miranda King is a 27 y.o. single [id number] at [redacted]w[redacted]d being seen today for ongoing prenatal care.  She is currently monitored for the following issues for this high-risk pregnancy and has Asthma; Tobacco use disorder; Seizure disorder (HCC); History of tattoo; Ruptured left tubal ectopic pregnancy causing hemoperitoneum; Supervision of other normal pregnancy, antepartum; ASCUS with positive high risk HPV cervical; and Pregnancy with adoption planned, third trimester on their problem list.  She tells me that she has decided on adoption for the baby. She has visited several agencies and has decided to go with one in [place]. She also would like permanent sterility, has medicaid.  Patient reports no complaints.  Contractions: Not present. Vag. Bleeding: None.  Movement: Present. Denies leaking of fluid.   The following portions of the patient's history were reviewed and updated as appropriate: allergies, current medications, past family history, past medical history, past social history, past surgical history and problem list.   Objective:   Vitals:   02/25/23 1448  BP: (!) 106/55  Pulse: 97  Weight: 176 lb (79.8 kg)    Fetal Status:     Movement: Present    FHR- 130s FH- 33 cm  General:  Alert, oriented and cooperative. Patient is in no acute distress.  Skin: Skin is warm and dry. No rash noted.   Cardiovascular: Normal heart rate noted  Respiratory: Normal respiratory effort, no problems with respiration noted  Abdomen: Soft, gravid, appropriate for gestational age.  Pain/Pressure: Present     Pelvic: Cervical exam deferred        Extremities: Normal range of motion.     Mental Status: Normal mood and affect. Normal behavior. Normal judgment and thought content.   Assessment and Plan:  Pregnancy: [id number] at [redacted]w[redacted]d 1. Supervision of other normal pregnancy, antepartum   2. [redacted] weeks gestation of pregnancy   3. Pregnancy with adoption  planned, third trimester   4. Unwanted fertility - sign MCD forms today  Preterm labor symptoms and general obstetric precautions including but not limited to vaginal bleeding, contractions, leaking of fluid and fetal movement were reviewed in detail with the patient. Please refer to After Visit Summary for other counseling recommendations.   Come back 2 weeks/prn sooner  Allie Bossier, MD

## 2023-03-08 ENCOUNTER — Other Ambulatory Visit: Payer: Self-pay | Admitting: Certified Nurse Midwife

## 2023-03-08 MED ORDER — PREPLUS 27-1 MG PO TABS: 1.0000 | ORAL_TABLET | Freq: Every day | ORAL | 3 refills | Status: AC

## 2023-03-08 NOTE — Progress Notes (Signed)
After hours call received from Nurse line, patient called in requesting refill of prenatal vitamins. Prescription sent to pharmacy on file.

## 2023-03-10 ENCOUNTER — Telehealth: Payer: Self-pay

## 2023-03-10 NOTE — Telephone Encounter (Signed)
Chart reviewed. Rx was sent in by Hartley Barefoot, CNM.

## 2023-03-11 ENCOUNTER — Ambulatory Visit: Payer: Medicaid Other | Admitting: Obstetrics

## 2023-03-11 ENCOUNTER — Encounter: Payer: Medicaid Other | Admitting: Obstetrics

## 2023-03-11 VITALS — BP 120/80 | HR 101 | Wt 178.0 lb

## 2023-03-11 DIAGNOSIS — F17210 Nicotine dependence, cigarettes, uncomplicated: Secondary | ICD-10-CM

## 2023-03-11 DIAGNOSIS — O99333 Smoking (tobacco) complicating pregnancy, third trimester: Secondary | ICD-10-CM

## 2023-03-11 DIAGNOSIS — Z3480 Encounter for supervision of other normal pregnancy, unspecified trimester: Secondary | ICD-10-CM

## 2023-03-11 DIAGNOSIS — Z2911 Encounter for prophylactic immunotherapy for respiratory syncytial virus (RSV): Secondary | ICD-10-CM

## 2023-03-11 DIAGNOSIS — Z23 Encounter for immunization: Secondary | ICD-10-CM

## 2023-03-11 DIAGNOSIS — F17200 Nicotine dependence, unspecified, uncomplicated: Secondary | ICD-10-CM

## 2023-03-11 DIAGNOSIS — Z3493 Encounter for supervision of normal pregnancy, unspecified, third trimester: Secondary | ICD-10-CM

## 2023-03-11 DIAGNOSIS — Z3A34 34 weeks gestation of pregnancy: Secondary | ICD-10-CM

## 2023-03-11 NOTE — Progress Notes (Deleted)
    Return Prenatal Note   Subjective  27 y.o. [id number] at [redacted]w[redacted]d presents for this follow-up prenatal visit.  Patient ***  Patient reports:   Denies vaginal bleeding or leaking fluid. Objective  Flow sheet Vitals:   Total weight gain: 41 lb (18.6 kg)  General Appearance  No acute distress, well appearing, and well nourished Pulmonary   Normal work of breathing Neurologic   Alert and oriented to person, place, and time Psychiatric   Mood and affect within normal limits  Assessment/Plan   Plan  27 y.o. [id number] at [redacted]w[redacted]d presents for follow-up OB visit. Reviewed prenatal record including previous visit note. There are no diagnoses linked to this encounter.  No problem-specific Assessment & Plan notes found for this encounter.    No orders of the defined types were placed in this encounter.  No follow-ups on file.   Future Appointments  Date Time Provider Department Center  03/11/2023  9:35 AM Julieanne Manson, MD [facility] None    For next visit:  {SJFprenatalcare:[id number]}      Julieanne Manson, DO [facility] [place]

## 2023-03-11 NOTE — Patient Instructions (Signed)

## 2023-03-11 NOTE — Progress Notes (Signed)
    Return Prenatal Note   Subjective  27 y.o. [id number] at [redacted]w[redacted]d presents for this follow-up prenatal visit. Pregnancy notable for late entry to prenatal care, maternal tobacco and THC use, asthma, hx of SUD, hx of seizure disorder not on medications, and abnormal pap smear ASCUS HPV+.   Patient is well today, does want RSV vaccine. Is still a little unsure about tubal ligation, signed consent last visit, but wants to keep it as an option. She feels done having children herself, planning to give this baby for adoption, is working with agency in [place], but states she may be a gestational carrier for her sister in the future.   Patient reports: Movement: Present Contractions: Irritability Denies vaginal bleeding or leaking fluid. Objective  Flow sheet Vitals: Pulse Rate: (!) 101 BP: 120/80 Fundal Height: 34 cm Fetal Heart Rate (bpm): 137 Total weight gain: 43 lb (19.5 kg)  General Appearance  No acute distress, well appearing, and well nourished Pulmonary   Normal work of breathing Neurologic   Alert and oriented to person, place, and time Psychiatric   Mood and affect within normal limits  Assessment/Plan   Plan  27 y.o. [id number] at [redacted]w[redacted]d by LMP = 22wk [place] presents for follow-up OB visit. Reviewed prenatal record including previous visit note. 1. Supervision of other normal pregnancy, antepartum - RSV vaccine given today - PTL precautions reviewed - Revisited pt's contraception plans and she still desires to consider BTL postpartum, but may want to review other options as well. Reviewed R/B/A of BTL, and walked her through how she can still be a gestational carrier down the road, even after BTL, if that is what she and her sister decide. If she is done with children for herself, strongly encouraged her to select long-term contraception (BTL, LARC).   2. Pregnancy with adoption planned, third trimester  3. Tobacco use disorder - Cessation encouraged  Return in about 2 weeks  (around 03/25/2023) for ROB.   Future Appointments  Date Time Provider Department Center  03/25/2023  8:15 AM Doreene Burke, CNM [facility] None    For next visit:  ROB with GBS screening     Julieanne Manson, DO [facility]

## 2023-03-25 ENCOUNTER — Ambulatory Visit (INDEPENDENT_AMBULATORY_CARE_PROVIDER_SITE_OTHER): Payer: Medicaid Other | Admitting: Certified Nurse Midwife

## 2023-03-25 ENCOUNTER — Other Ambulatory Visit (HOSPITAL_COMMUNITY)
Admission: RE | Admit: 2023-03-25 | Discharge: 2023-03-25 | Disposition: A | Discharge status: Home / Self Care | Payer: Medicaid Other | Source: Ambulatory Visit | Attending: Certified Nurse Midwife | Admitting: Certified Nurse Midwife

## 2023-03-25 VITALS — BP 110/74 | HR 73 | Wt 181.2 lb

## 2023-03-25 DIAGNOSIS — Z113 Encounter for screening for infections with a predominantly sexual mode of transmission: Secondary | ICD-10-CM

## 2023-03-25 DIAGNOSIS — Z3685 Encounter for antenatal screening for Streptococcus B: Secondary | ICD-10-CM

## 2023-03-25 DIAGNOSIS — Z3483 Encounter for supervision of other normal pregnancy, third trimester: Secondary | ICD-10-CM

## 2023-03-25 DIAGNOSIS — Z3A36 36 weeks gestation of pregnancy: Secondary | ICD-10-CM

## 2023-03-25 NOTE — Addendum Note (Signed)
Addended by: Sheliah Hatch on: 03/25/2023 08:32 AM   Modules accepted: Orders

## 2023-03-25 NOTE — Patient Instructions (Signed)

## 2023-03-25 NOTE — Progress Notes (Signed)
ROB doing well, feeling good movement. GBS and cultures self swab today. Reviewed collection with pt. She verbalizes understanding. Discussed labor precautions. Pt has adoptive parents picked. They will come to the hospital after delivery. ROB in 1 wk.   Doreene Burke, CNM

## 2023-03-26 LAB — CERVICOVAGINAL ANCILLARY ONLY
Chlamydia: NEGATIVE
Comment: NEGATIVE
Comment: NORMAL
Neisseria Gonorrhea: NEGATIVE

## 2023-03-27 LAB — STREP GP B NAA: Strep Gp B NAA: NEGATIVE

## 2023-04-01 ENCOUNTER — Ambulatory Visit (INDEPENDENT_AMBULATORY_CARE_PROVIDER_SITE_OTHER): Payer: Medicaid Other | Admitting: Licensed Practical Nurse

## 2023-04-01 ENCOUNTER — Encounter: Payer: Self-pay | Admitting: Licensed Practical Nurse

## 2023-04-01 VITALS — BP 121/69 | HR 84 | Wt 180.2 lb

## 2023-04-01 DIAGNOSIS — Z3483 Encounter for supervision of other normal pregnancy, third trimester: Secondary | ICD-10-CM

## 2023-04-01 DIAGNOSIS — Z3A37 37 weeks gestation of pregnancy: Secondary | ICD-10-CM

## 2023-04-01 DIAGNOSIS — Z3480 Encounter for supervision of other normal pregnancy, unspecified trimester: Secondary | ICD-10-CM

## 2023-04-01 NOTE — Progress Notes (Signed)
Routine Prenatal Care Visit  Subjective  Miranda King is a 27 y.o. [id number] at [redacted]w[redacted]d being seen today for ongoing prenatal care.  She is currently monitored for the following issues for this low-risk pregnancy and has Asthma; Tobacco use disorder; Seizure disorder (HCC); History of tattoo; Ruptured left tubal ectopic pregnancy causing hemoperitoneum; Supervision of other normal pregnancy, antepartum; ASCUS with positive high risk HPV cervical; and Pregnancy with adoption planned, third trimester on their problem list.  ----------------------------------------------------------------------------------- Patient reports  sleep disturbances-needs to use the BR often, and feels tired. -Doing well, feels overall great. Is excited to give birth and for the adoptive family to have their baby.  -Her mother will be her support person, her Brother will watch her children.  -Desires Nexplanon at 6wks for contraceptions.  -Planning to return to school or work at 4-6 wks PP.   Contractions: Not present. Vag. Bleeding: None.  Movement: Present. Leaking Fluid denies.  ----------------------------------------------------------------------------------- The following portions of the patient's history were reviewed and updated as appropriate: allergies, current medications, past family history, past medical history, past social history, past surgical history and problem list. Problem list updated.  Objective  Blood pressure 121/69, pulse 84, weight 180 lb 3.2 oz (81.7 kg), last menstrual period 07/12/2022. Pregravid weight 135 lb (61.2 kg) Total Weight Gain 45 lb 3.2 oz (20.5 kg) Urinalysis: Urine Protein    Urine Glucose    Fetal Status: Fetal Heart Rate (bpm): 140 Fundal Height: 37 cm Movement: Present  Presentation: Vertex  General:  Alert, oriented and cooperative. Patient is in no acute distress.  Skin: Skin is warm and dry. No rash noted.   Cardiovascular: Normal heart rate noted  Respiratory:  Normal respiratory effort, no problems with respiration noted  Abdomen: Soft, gravid, appropriate for gestational age. Pain/Pressure: Present     Pelvic:  Cervical exam performed Dilation: 1 Effacement (%): 40 Station: -2  Extremities: Normal range of motion.  Edema: None  Mental Status: Normal mood and affect. Normal behavior. Normal judgment and thought content.   Assessment   27 y.o. [id number] at [redacted]w[redacted]d by  04/18/2023, by Last Menstrual Period presenting for routine prenatal visit  Plan   fifth Problems (from 12/17/22 to present)     Problem Noted Diagnosed Resolved   Supervision of other normal pregnancy, antepartum 01/01/2023 by Loman Chroman, CMA  No   Overview Addendum 03/11/2023 10:28 AM by Julieanne Manson, MD   Clinical Staff Provider  Office Location  [facility] Dating  04/21/2023, by Ultrasound  Language  English Anatomy US   Normal  Flu Vaccine  declined Genetic Screen  NIPS: Neg/Female: 01/02/23  TDaP vaccine  01/30/2023 Hgb A1C or  GTT Early : Third trimester : 67  Covid no   LAB RESULTS   Rhogam  O/Positive/-- (09/19 1040)  Blood Type O/Positive/-- (09/19 1040)   RSV 03/11/23 Antibody Negative (09/19 1040)  Feeding Plan breast Rubella 1.21 (09/19 1040)  Contraception Unsure RPR Non Reactive (09/19 1040)   Circumcision Yes HBsAg Negative (09/19 1040)   Pediatrician  [facility] [place] HIV Non Reactive (09/19 1040)  Support Person Robert Varicella 602 (09/19 1040)  Prenatal Classes online GBS  (For PCN allergy, check sensitivities)     Hep C Non Reactive (09/19 1040)   BTL Consent 02/27/23 Pap Diagnosis  Date Value Ref Range Status  01/02/2023 (A)  Final   - Atypical squamous cells of undetermined significance (ASC-US)    VBAC Consent N/a Hgb Electro  CF   RSV Vaccine 03/11/23 SMA                    Term labor symptoms and general obstetric precautions including but not limited to vaginal bleeding, contractions, leaking of fluid and fetal  movement were reviewed in detail with the patient. Please refer to After Visit Summary for other counseling recommendations.   Return in about 1 week (around 04/08/2023) for ROB.  Carie Caddy, CNM   [facility]  04/01/23  8:47 AM

## 2023-04-07 ENCOUNTER — Ambulatory Visit (INDEPENDENT_AMBULATORY_CARE_PROVIDER_SITE_OTHER): Payer: Medicaid Other | Admitting: Licensed Practical Nurse

## 2023-04-07 ENCOUNTER — Encounter: Payer: Self-pay | Admitting: Licensed Practical Nurse

## 2023-04-07 VITALS — BP 126/73 | HR 101 | Wt 180.1 lb

## 2023-04-07 DIAGNOSIS — Z3483 Encounter for supervision of other normal pregnancy, third trimester: Secondary | ICD-10-CM

## 2023-04-07 DIAGNOSIS — Z3A38 38 weeks gestation of pregnancy: Secondary | ICD-10-CM

## 2023-04-07 LAB — POCT URINALYSIS DIPSTICK
Bilirubin, UA: NEGATIVE
Blood, UA: NEGATIVE
Glucose, UA: NEGATIVE
Ketones, UA: NEGATIVE
Leukocytes, UA: NEGATIVE
Nitrite, UA: NEGATIVE
Protein, UA: NEGATIVE
Spec Grav, UA: 1.015 (ref 1.010–1.025)
Urobilinogen, UA: 0.2 U/dL
pH, UA: 7 (ref 5.0–8.0)

## 2023-04-07 NOTE — Progress Notes (Signed)
Routine Prenatal Care Visit  Subjective  Miranda King is a 27 y.o. [id number] at [redacted]w[redacted]d being seen today for ongoing prenatal care.  She is currently monitored for the following issues for this low-risk pregnancy and has Asthma; Tobacco use disorder; Seizure disorder (HCC); History of tattoo; Ruptured left tubal ectopic pregnancy causing hemoperitoneum; Supervision of other normal pregnancy, antepartum; ASCUS with positive high risk HPV cervical; and Pregnancy with adoption planned, third trimester on their problem list.  ----------------------------------------------------------------------------------- Patient reports  sleep disturbance d/t need to use BR frequently, runny nose and nasal congestion-her child both had a cold. .  Otherwise doing well, mood is good. Her family is coming form out of town for the holidays.  -Reviewed membrane sweep at next visit if desired, IOL at 21 wks  Contractions: Irritability. Vag. Bleeding: None.  Movement: Present. Leaking Fluid denies.  ----------------------------------------------------------------------------------- The following portions of the patient's history were reviewed and updated as appropriate: allergies, current medications, past family history, past medical history, past social history, past surgical history and problem list. Problem list updated.  Objective  Blood pressure 126/73, pulse (!) 101, weight 180 lb 1.6 oz (81.7 kg), last menstrual period 07/12/2022. Pregravid weight 135 lb (61.2 kg) Total Weight Gain 45 lb 1.6 oz (20.5 kg) Urinalysis: Urine Protein    Urine Glucose    Fetal Status: Fetal Heart Rate (bpm): 140 Fundal Height: 38 cm Movement: Present  Presentation: Vertex  General:  Alert, oriented and cooperative. Patient is in no acute distress.  Skin: Skin is warm and dry. No rash noted.   Cardiovascular: Normal heart rate noted  Respiratory: Normal respiratory effort, no problems with respiration noted  Abdomen: Soft,  gravid, appropriate for gestational age. Pain/Pressure: Present     Pelvic:  Cervical exam performed Dilation: 3 Effacement (%): 50 Station: -2  Extremities: Normal range of motion.  Edema: None  Mental Status: Normal mood and affect. Normal behavior. Normal judgment and thought content.   Assessment   27 y.o. [id number] at [redacted]w[redacted]d by  04/18/2023, by Last Menstrual Period presenting for routine prenatal visit  Plan   fifth Problems (from 12/17/22 to present)     Problem Noted Diagnosed Resolved   Supervision of other normal pregnancy, antepartum 01/01/2023 by Loman Chroman, CMA  No   Overview Addendum 04/01/2023  8:46 AM by Ellwood Sayers, CNM   Clinical Staff Provider  Office Location  [facility] Dating  04/21/2023, by Ultrasound  Language  English Anatomy US   Normal  Flu Vaccine  declined Genetic Screen  NIPS: Neg/Female: 01/02/23  TDaP vaccine  01/30/2023 Hgb A1C or  GTT Early : Third trimester : 54  Covid no   LAB RESULTS   Rhogam  O/Positive/-- (09/19 1040)  Blood Type O/Positive/-- (09/19 1040)   RSV 03/11/23 Antibody Negative (09/19 1040)  Feeding Plan breast Rubella 1.21 (09/19 1040)  Contraception Nexplanon  RPR Non Reactive (09/19 1040)   Circumcision Yes HBsAg Negative (09/19 1040)   Pediatrician  [facility]: [place] HIV Non Reactive (09/19 1040)  Support Person Robert Varicella 602 (09/19 1040)  Prenatal Classes online GBS  (For PCN allergy, check sensitivities)     Hep C Non Reactive (09/19 1040)   BTL Consent 02/27/23 Pap Diagnosis  Date Value Ref Range Status  01/02/2023 (A)  Final   - Atypical squamous cells of undetermined significance (ASC-US)    VBAC Consent N/a Hgb Electro      CF   RSV Vaccine 03/11/23 SMA  Term labor symptoms and general obstetric precautions including but not limited to vaginal bleeding, contractions, leaking of fluid and fetal movement were reviewed in detail with the patient. Please refer to  After Visit Summary for other counseling recommendations.   Return in about 1 week (around 04/14/2023) for ROB.  Carie Caddy, CNM  [facility]  04/07/23  8:41 AM

## 2023-04-11 ENCOUNTER — Inpatient Hospital Stay: Payer: Medicaid Other | Admitting: Anesthesiology

## 2023-04-11 ENCOUNTER — Encounter: Payer: Self-pay | Admitting: Obstetrics

## 2023-04-11 ENCOUNTER — Inpatient Hospital Stay
Admission: EM | Admit: 2023-04-11 | Discharge: 2023-04-12 | DRG: 806 | Disposition: A | Discharge status: Home / Self Care | Payer: Medicaid Other | Attending: Obstetrics | Admitting: Obstetrics

## 2023-04-11 ENCOUNTER — Telehealth: Payer: Self-pay

## 2023-04-11 ENCOUNTER — Other Ambulatory Visit: Payer: Self-pay

## 2023-04-11 DIAGNOSIS — Z3A39 39 weeks gestation of pregnancy: Secondary | ICD-10-CM | POA: Diagnosis not present

## 2023-04-11 DIAGNOSIS — F17210 Nicotine dependence, cigarettes, uncomplicated: Secondary | ICD-10-CM | POA: Diagnosis present

## 2023-04-11 DIAGNOSIS — O99334 Smoking (tobacco) complicating childbirth: Secondary | ICD-10-CM | POA: Diagnosis present

## 2023-04-11 DIAGNOSIS — Z833 Family history of diabetes mellitus: Secondary | ICD-10-CM

## 2023-04-11 DIAGNOSIS — Z8249 Family history of ischemic heart disease and other diseases of the circulatory system: Secondary | ICD-10-CM

## 2023-04-11 DIAGNOSIS — F1290 Cannabis use, unspecified, uncomplicated: Secondary | ICD-10-CM | POA: Diagnosis present

## 2023-04-11 DIAGNOSIS — Z3480 Encounter for supervision of other normal pregnancy, unspecified trimester: Principal | ICD-10-CM

## 2023-04-11 DIAGNOSIS — O99324 Drug use complicating childbirth: Secondary | ICD-10-CM | POA: Diagnosis present

## 2023-04-11 DIAGNOSIS — O26893 Other specified pregnancy related conditions, third trimester: Secondary | ICD-10-CM | POA: Diagnosis present

## 2023-04-11 LAB — CBC
HCT: 34.3 % — ABNORMAL LOW (ref 36.0–46.0)
Hemoglobin: 12 g/dL (ref 12.0–15.0)
MCH: 29.8 pg (ref 26.0–34.0)
MCHC: 35 g/dL (ref 30.0–36.0)
MCV: 85.1 fL (ref 80.0–100.0)
Platelets: 240 10*3/uL (ref 150–400)
RBC: 4.03 MIL/uL (ref 3.87–5.11)
RDW: 14.9 % (ref 11.5–15.5)
WBC: 7.5 10*3/uL (ref 4.0–10.5)
nRBC: 0 % (ref 0.0–0.2)

## 2023-04-11 LAB — TYPE AND SCREEN
ABO/RH(D): O POS
Antibody Screen: NEGATIVE

## 2023-04-11 MED ORDER — DIPHENHYDRAMINE HCL 25 MG PO CAPS
25.0000 mg | ORAL_CAPSULE | Freq: Four times a day (QID) | ORAL | Status: DC | PRN
Start: 2023-04-11 — End: 2023-04-12

## 2023-04-11 MED ORDER — ZOLPIDEM TARTRATE 5 MG PO TABS: 5.0000 mg | ORAL_TABLET | Freq: Every evening | ORAL | Status: DC | PRN

## 2023-04-11 MED ORDER — OXYTOCIN 10 UNIT/ML IJ SOLN
INTRAMUSCULAR | Status: AC
  Filled 2023-04-11: qty 2

## 2023-04-11 MED ORDER — LIDOCAINE HCL (PF) 1 % IJ SOLN: 30.0000 mL | INTRAMUSCULAR | Status: DC | PRN

## 2023-04-11 MED ORDER — PHENYLEPHRINE 80 MCG/ML (10ML) SYRINGE FOR IV PUSH (FOR BLOOD PRESSURE SUPPORT): 80.0000 ug | PREFILLED_SYRINGE | INTRAVENOUS | Status: DC | PRN

## 2023-04-11 MED ORDER — ONDANSETRON HCL 4 MG PO TABS: 4.0000 mg | ORAL_TABLET | ORAL | Status: DC | PRN

## 2023-04-11 MED ORDER — METHYLERGONOVINE MALEATE 0.2 MG PO TABS: 0.2000 mg | ORAL_TABLET | ORAL | Status: DC | PRN

## 2023-04-11 MED ORDER — LIDOCAINE-EPINEPHRINE (PF) 1.5 %-1:200000 IJ SOLN
INTRAMUSCULAR | Status: DC | PRN
  Administered 2023-04-11: 3 mL via EPIDURAL

## 2023-04-11 MED ORDER — FENTANYL-BUPIVACAINE-NACL 0.5-0.125-0.9 MG/250ML-% EP SOLN
EPIDURAL | Status: DC | PRN
  Administered 2023-04-11: 12 mL/h via EPIDURAL

## 2023-04-11 MED ORDER — LIDOCAINE HCL (PF) 1 % IJ SOLN
INTRAMUSCULAR | Status: AC
  Filled 2023-04-11: qty 30

## 2023-04-11 MED ORDER — OXYTOCIN-SODIUM CHLORIDE 30-0.9 UT/500ML-% IV SOLN: 2.5000 [IU]/h | INTRAVENOUS | Status: DC | PRN

## 2023-04-11 MED ORDER — ACETAMINOPHEN 325 MG PO TABS
650.0000 mg | ORAL_TABLET | ORAL | Status: DC | PRN
Start: 2023-04-11 — End: 2023-04-12
  Filled 2023-04-11: qty 2

## 2023-04-11 MED ORDER — LACTATED RINGERS IV SOLN
500.0000 mL | Freq: Once | INTRAVENOUS | Status: AC
  Administered 2023-04-11: 500 mL via INTRAVENOUS

## 2023-04-11 MED ORDER — TETANUS-DIPHTH-ACELL PERTUSSIS 5-2.5-18.5 LF-MCG/0.5 IM SUSY
0.5000 mL | PREFILLED_SYRINGE | Freq: Once | INTRAMUSCULAR | Status: DC
  Filled 2023-04-11: qty 0.5

## 2023-04-11 MED ORDER — SIMETHICONE 80 MG PO CHEW: 80.0000 mg | CHEWABLE_TABLET | ORAL | Status: DC | PRN

## 2023-04-11 MED ORDER — TETANUS-DIPHTH-ACELL PERTUSSIS 5-2.5-18.5 LF-MCG/0.5 IM SUSY
0.5000 mL | PREFILLED_SYRINGE | Freq: Once | INTRAMUSCULAR | Status: DC
Start: 2023-04-12 — End: 2023-04-12

## 2023-04-11 MED ORDER — ACETAMINOPHEN 325 MG PO TABS
650.0000 mg | ORAL_TABLET | ORAL | Status: DC | PRN
  Administered 2023-04-11 – 2023-04-12 (×3): 650 mg via ORAL
  Filled 2023-04-11 (×2): qty 2

## 2023-04-11 MED ORDER — WITCH HAZEL-GLYCERIN EX PADS
MEDICATED_PAD | CUTANEOUS | Status: DC | PRN
  Administered 2023-04-11: 1 via TOPICAL
  Filled 2023-04-11: qty 100

## 2023-04-11 MED ORDER — AMMONIA AROMATIC IN INHA
RESPIRATORY_TRACT | Status: AC
  Filled 2023-04-11: qty 10

## 2023-04-11 MED ORDER — OXYTOCIN-SODIUM CHLORIDE 30-0.9 UT/500ML-% IV SOLN
2.5000 [IU]/h | INTRAVENOUS | Status: DC
  Administered 2023-04-11: 2.5 [IU]/h via INTRAVENOUS
  Filled 2023-04-11: qty 500

## 2023-04-11 MED ORDER — ONDANSETRON HCL 4 MG/2ML IJ SOLN: 4.0000 mg | INTRAMUSCULAR | Status: DC | PRN

## 2023-04-11 MED ORDER — METHYLERGONOVINE MALEATE 0.2 MG/ML IJ SOLN: 0.2000 mg | INTRAMUSCULAR | Status: DC | PRN

## 2023-04-11 MED ORDER — FENTANYL-BUPIVACAINE-NACL 0.5-0.125-0.9 MG/250ML-% EP SOLN
EPIDURAL | Status: AC
  Filled 2023-04-11: qty 250

## 2023-04-11 MED ORDER — BENZOCAINE-MENTHOL 20-0.5 % EX AERO
1.0000 | INHALATION_SPRAY | CUTANEOUS | Status: DC | PRN
  Administered 2023-04-11: 1 via TOPICAL
  Filled 2023-04-11: qty 56

## 2023-04-11 MED ORDER — CALCIUM CARBONATE ANTACID 500 MG PO CHEW: 2.0000 | CHEWABLE_TABLET | ORAL | Status: DC | PRN

## 2023-04-11 MED ORDER — ONDANSETRON HCL 4 MG/2ML IJ SOLN
4.0000 mg | Freq: Four times a day (QID) | INTRAMUSCULAR | Status: DC | PRN
  Administered 2023-04-11: 4 mg via INTRAVENOUS
  Filled 2023-04-11: qty 2

## 2023-04-11 MED ORDER — COCONUT OIL OIL
1.0000 | TOPICAL_OIL | Status: DC | PRN
Start: 2023-04-11 — End: 2023-04-12
  Administered 2023-04-11: 1 via TOPICAL
  Filled 2023-04-11: qty 7.5

## 2023-04-11 MED ORDER — LACTATED RINGERS IV SOLN: 500.0000 mL | INTRAVENOUS | Status: DC | PRN

## 2023-04-11 MED ORDER — LIDOCAINE HCL (PF) 1 % IJ SOLN
INTRAMUSCULAR | Status: DC | PRN
  Administered 2023-04-11: 2 mL

## 2023-04-11 MED ORDER — LACTATED RINGERS IV SOLN: INTRAVENOUS | Status: DC

## 2023-04-11 MED ORDER — OXYTOCIN BOLUS FROM INFUSION
333.0000 mL | Freq: Once | INTRAVENOUS | Status: AC
  Administered 2023-04-11: 333 mL via INTRAVENOUS

## 2023-04-11 MED ORDER — EPHEDRINE 5 MG/ML INJ: 10.0000 mg | INTRAVENOUS | Status: DC | PRN

## 2023-04-11 MED ORDER — FENTANYL-BUPIVACAINE-NACL 0.5-0.125-0.9 MG/250ML-% EP SOLN: 12.0000 mL/h | EPIDURAL | Status: DC | PRN

## 2023-04-11 MED ORDER — HYDROXYZINE HCL 25 MG PO TABS: 50.0000 mg | ORAL_TABLET | Freq: Four times a day (QID) | ORAL | Status: DC | PRN

## 2023-04-11 MED ORDER — MAGNESIUM HYDROXIDE 400 MG/5ML PO SUSP: 30.0000 mL | ORAL | Status: DC | PRN

## 2023-04-11 MED ORDER — DIPHENHYDRAMINE HCL 50 MG/ML IJ SOLN: 12.5000 mg | INTRAMUSCULAR | Status: DC | PRN

## 2023-04-11 MED ORDER — OXYCODONE-ACETAMINOPHEN 5-325 MG PO TABS: 1.0000 | ORAL_TABLET | ORAL | Status: DC | PRN

## 2023-04-11 MED ORDER — FENTANYL CITRATE (PF) 100 MCG/2ML IJ SOLN: 50.0000 ug | INTRAMUSCULAR | Status: DC | PRN

## 2023-04-11 MED ORDER — DOCUSATE SODIUM 100 MG PO CAPS
100.0000 mg | ORAL_CAPSULE | Freq: Two times a day (BID) | ORAL | Status: DC
  Administered 2023-04-12: 100 mg via ORAL
  Filled 2023-04-11: qty 1

## 2023-04-11 MED ORDER — IBUPROFEN 600 MG PO TABS
600.0000 mg | ORAL_TABLET | Freq: Four times a day (QID) | ORAL | Status: DC
  Administered 2023-04-12: 600 mg via ORAL
  Filled 2023-04-11: qty 1

## 2023-04-11 MED ORDER — PRENATAL MULTIVITAMIN CH
1.0000 | ORAL_TABLET | Freq: Every day | ORAL | Status: DC
  Filled 2023-04-11: qty 1

## 2023-04-11 NOTE — Telephone Encounter (Signed)
Chart reviewed. Appear pt is currently at ED. No notes available yet. Called patient. Voicemail is full.

## 2023-04-11 NOTE — Progress Notes (Signed)
Labor Progress Note   ASSESSMENT/PLAN   Miranda King 27 y.o.   [id number]  at [redacted]w[redacted]d here for labor.  FWB:  - Fetal well being assessed: Category 1        GBS: - GBS negative  LABOR: -  Active labor, doing well. - Khailee desires AROM at this time - AROM performed for clear fluid - Pain Management: Epidural - Anticipate SVD   Labor Progress 1229: 5/70/-1 1432: 6/80/-1, BBOW 1749: 7.5/90-0, AROM  Principal Problem:   Fetal tachycardia before the onset of labor   SUBJECTIVE/OBJECTIVE   SUBJECTIVE:   Miranda King is comfortable with her epidural.  OBJECTIVE: Vital Signs: Patient Vitals for the past 12 hrs:  BP Temp Temp src Pulse Resp Height Weight  04/11/23 1258 109/69 98.3 F (36.8 C) Oral 89 16 5\' 6"  (1.676 m) 81.6 kg    Last SVE:  Dilation: 7.5 Effacement (%): 90 Cervical Position: Middle Station: 0 Presentation: Vertex Exam by:: Quitman Livings, CNM -  , Rupture Date: 04/11/23, Rupture Time: 1749,    FHR:   - Baseline: 140 - Variability: moderate - Accels: present - Decels: early Category 1  UTERINE ACTIVITY:  Contractions: q 2-5 minutes  Glenetta Borg, CNM

## 2023-04-11 NOTE — Telephone Encounter (Signed)
TRIAGE VOICEMAIL: Patient states she is [redacted] weeks pregnant and is having a lot of contractions. They don't feel like contractions in previous pregnancy. She is feeling urge to have bowel movement and feels a lot of pelvic pressure. She is having to urinate frequently. She states her water hasn't broke. She has her children with her. She is unsure what to do.

## 2023-04-11 NOTE — Discharge Summary (Signed)
Postpartum Discharge Summary  Date of Service updated***     Patient Name: Miranda King DOB: 06/02/1995 MRN: [id number]  Date of admission: 04/11/2023 Delivery date:04/11/2023 Delivering provider: Glenetta Borg Date of discharge: 04/11/2023  Admitting diagnosis: Fetal tachycardia before the onset of labor [P03.810] Intrauterine pregnancy: [redacted]w[redacted]d     Secondary diagnosis:  Principal Problem:   Fetal tachycardia before the onset of labor  Additional problems: none    Discharge diagnosis: Term Pregnancy Delivered                                              Post partum procedures:{Postpartum procedures:23558} Augmentation: AROM Complications: None  Hospital course: Onset of Labor With Vaginal Delivery      27 y.o. yo [id number] at [redacted]w[redacted]d was admitted in Active Labor on 04/11/2023. Labor course was uncomplicated.  Membrane Rupture Time/Date: 5:49 PM,04/11/2023  Delivery Method: NSVD Operative Delivery:N/A Episiotomy: none Lacerations: right labial, not repaired Patient had a postpartum course complicated by ***.  She is ambulating, tolerating a regular diet, passing flatus, and urinating well. Patient is discharged home in stable condition on 04/11/23.  Newborn Data: Birth date:04/11/2023 Birth time:8:43 PM Gender:Female Living status:  Apgars:10 ,10  Weight:   Magnesium Sulfate received: No BMZ received: No Rhophylac:N/A MMR:N/A T-DaP:{Tdap:23962} Flu: {NFA:21308} RSV Vaccine received: Yes Transfusion:{Transfusion received:30440034} Immunizations administered: Immunization History  Administered Date(s) Administered   DTaP 04/03/1994, 06/04/1994, 08/22/1994, 10/20/1995, 12/13/1998   HIB (PRP-OMP) 04/03/1994, 06/04/1994, 02/18/1995   HPV Quadrivalent 01/22/2008, 03/14/2010   Hepatitis A 12/19/2005, 01/22/2008   Hepatitis B 04/03/1994, 02/18/1995, 10/20/1995   IPV 04/03/1994, 06/04/1994, 08/22/1994, 12/13/1998   MMR 02/18/1995, 12/13/1998   Meningococcal  Conjugate 01/22/2008   Meningococcal polysaccharide vaccine (MPSV4) 03/14/2010   Rsv, Bivalent, Protein Subunit Rsvpref,pf Verdis Frederickson) 03/11/2023   Tdap 05/22/2012, 08/08/2017    Physical exam  Vitals:   04/11/23 2015 04/11/23 2058 04/11/23 2100 04/11/23 2105  BP:  (!) 112/59    Pulse:  (!) 107    Resp:      Temp:      TempSrc:      SpO2: 99%  100% 99%  Weight:      Height:       General: {Exam; general:21111117} Lochia: {Desc; appropriate/inappropriate:30686::"appropriate"} Uterine Fundus: {Desc; firm/soft:30687} Incision: {Exam; incision:21111123} DVT Evaluation: {Exam; dvt:2111122} Labs: Lab Results  Component Value Date   WBC 7.5 04/11/2023   HGB 12.0 04/11/2023   HCT 34.3 (L) 04/11/2023   MCV 85.1 04/11/2023   PLT 240 04/11/2023      Latest Ref Rng & Units 07/14/2022    4:52 AM  CMP  Glucose 70 - 99 mg/dL 657   BUN 6 - 20 mg/dL 11   Creatinine 8.46 - 1.00 mg/dL 9.62   Sodium 952 - 841 mmol/L 136   Potassium 3.5 - 5.1 mmol/L 4.1   Chloride 98 - 111 mmol/L 109   CO2 22 - 32 mmol/L 22   Calcium 8.9 - 10.3 mg/dL 8.5    Edinburgh Score:     No data to display            After visit meds:  Allergies as of 04/11/2023       Reactions   Coconut Flavoring Agent (non-screening)    Tramadol Hcl Nausea And Vomiting     Med Rec must be completed prior to using this  SMARTLINK***        Discharge home in stable condition Infant Feeding: {Baby feeding:23562} Infant Disposition:{CHL IP OB HOME WITH ZOXWRU:04540} Discharge instruction: per After Visit Summary and Postpartum booklet. Activity: Advance as tolerated. Pelvic rest for 6 weeks.  Diet: {OB diet:21111121} Anticipated Birth Control: {Birth Control:23956} Postpartum Appointment:6 weeks Additional Postpartum F/U:  video visit in 2 weeks Future Appointments: Future Appointments  Date Time Provider Department Center  04/15/2023  8:15 AM Mirna Mires, CNM [facility] None  04/22/2023  8:15 AM  Dominica Severin, CNM [facility] None   Follow up Visit:  Follow-up Information     Glenetta Borg, CNM. Schedule an appointment as soon as possible for a visit.   Specialty: Obstetrics Why: Video visit in 2 weeks, office visit in 6 weeks Contact information: [address] [place] [address] [phone]                     04/11/2023 Glenetta Borg, CNM

## 2023-04-11 NOTE — Telephone Encounter (Signed)
Patient is currently admitted to L&D for evaluation.

## 2023-04-11 NOTE — Anesthesia Procedure Notes (Addendum)
Epidural Patient location during procedure: OB Start time: 04/11/2023 3:48 PM End time: 04/11/2023 4:02 PM  Staffing Anesthesiologist: Foye Deer, MD Performed: anesthesiologist   Preanesthetic Checklist Completed: patient identified, IV checked, site marked, risks and benefits discussed, surgical consent, monitors and equipment checked, pre-op evaluation and timeout performed  Epidural Patient position: sitting Prep: ChloraPrep Patient monitoring: heart rate, continuous pulse ox and blood pressure Approach: midline Location: L3-L4 Injection technique: LOR saline  Needle:  Needle type: Tuohy  Needle gauge: 18 G Needle length: 9 cm Needle insertion depth: 6 cm Catheter type: closed end Catheter size: 20 Guage Catheter at skin depth: 11 cm Test dose: negative and 1.5% lidocaine with Epi 1:200 K  Assessment Events: blood not aspirated, no cerebrospinal fluid, injection not painful, no injection resistance and no paresthesia  Additional Notes Reason for block:procedure for pain

## 2023-04-11 NOTE — H&P (Signed)
[facility] Labor & Delivery  History and Physical  ASSESSMENT AND PLAN   Miranda King is a 27 y.o. [id number] at [redacted]w[redacted]d with EDD: 04/18/2023, by Last Menstrual Period admitted for labor.   -Admit for labor -Admission labs -Pharmacological pain relief as desired -Intermittent auscultation -Dr. Valentino Saxon notified of admission  Fetal Status: - cephalic presentation by Leopold's and SVE - EFW: 6.5 lbs by Leopold's - FHT currently category 1   Labs/Immunizations: Prenatal labs and studies: ABO, Rh: O/Positive/-- (09/19 1040) Antibody: Negative (09/19 1040) Rubella: 1.21 (09/19 1040) Varicella: 602 (09/19 1040)  RPR: Non Reactive (10/17 1018)  HBsAg: Negative (09/19 1040)  HepC: Non Reactive (09/19 1040) HIV: Non Reactive (10/17 1018)  GBS: Negative/-- (12/10 0843)   TDAP: given prenatally Flu: no RSV: yes  Postpartum Plan: - Feeding: No data found - Contraception: plans Postpartum BTL - Prenatal Care Provider: AOB     HPI   Chief Complaint: contractions  Miranda King is a 27 y.o. [id number] at [redacted]w[redacted]d who presents for contractions. She reports contractions that got started around 0945 this morning and have gotten progressively stronger. She has not felt the baby move since this morning. She is planning on adopting this baby out and has been in contact with the adoptive family.   Pregnancy Complications Patient Active Problem List   Diagnosis Date Noted   Fetal tachycardia before the onset of labor 04/11/2023   Pregnancy with adoption planned, third trimester 02/25/2023   ASCUS with positive high risk HPV cervical 02/13/2023   Supervision of other normal pregnancy, antepartum 01/01/2023   Ruptured left tubal ectopic pregnancy causing hemoperitoneum 07/14/2022   History of tattoo 05/23/2015   Seizure disorder (HCC) 10/04/2014   Asthma 09/30/2014   Tobacco use disorder 09/30/2014    Review of Systems A twelve point review of systems was  negative except as stated in HPI.   HISTORY   Medications Medications Prior to Admission  Medication Sig Dispense Refill Last Dose/Taking   albuterol (VENTOLIN HFA) 108 (90 Base) MCG/ACT inhaler Inhale into the lungs.      Prenatal Vit-Fe Fumarate-FA (PREPLUS) 27-1 MG TABS Take 1 tablet by mouth daily. 90 tablet 3     Allergies is allergic to coconut flavoring agent (non-screening) and tramadol hcl.   OB History OB History  Gravida Para Term Preterm AB Living  4 2 2  0 1 2  SAB IAB Ectopic Multiple Live Births  0 0 1 0 2    # Outcome Date GA Lbr Len/2nd Weight Sex Type Anes PTL Lv  4 Current           3 Ectopic 07/14/22     ECTOPIC     2 Term 08/23/17 [redacted]w[redacted]d / 00:23 3190 g M Vag-Spont EPI  LIV     Birth Comments: No anomalies noted at delivery     Name: King,BOY Miranda     Apgar1: 8  Apgar5: 9  1 Term 07/05/12 [redacted]w[redacted]d  2977 g F Vag-Spont   LIV    Past Medical History Past Medical History:  Diagnosis Date   Asthma    Seizures (HCC)    Tobacco use disorder     Past Surgical History Past Surgical History:  Procedure Laterality Date   DIAGNOSTIC LAPAROSCOPY WITH REMOVAL OF ECTOPIC PREGNANCY N/A 07/13/2022   Procedure: DIAGNOSTIC LAPAROSCOPY WITH REMOVAL OF ECTOPIC PREGNANCY;  Surgeon: Christeen Douglas, MD;  Location: [facility] ORS;  Service: Gynecology;  Laterality: N/A;   DILATION AND EVACUATION N/A  10/03/2015   Procedure: DILATATION AND EVACUATION;  Surgeon: Nadara Mustard, MD;  Location: [facility] ORS;  Service: Gynecology;  Laterality: N/A;   VAGINAL DELIVERY      Social History  reports that she has been smoking cigarettes. She has never used smokeless tobacco. She reports current drug use. Drug: Marijuana. She reports that she does not drink alcohol.   Family History family history includes Asthma in her sister; Diabetes in her maternal grandmother; Hypertension in her maternal grandmother; Migraines in her mother.   PHYSICAL EXAM   There were no vitals filed for  this visit.  Constitutional: No acute distress, well appearing, and well nourished. Neurologic: She is alert and conversational.  Psychiatric: She has a normal mood and affect.  Musculoskeletal: Normal gait, grossly normal range of motion Cardiovascular: Normal rate.   Pulmonary/Chest: Normal work of breathing.  Gastrointestinal/Abdominal: Soft. Gravid. There is no tenderness.  Skin: Skin is warm and dry. No rash noted.  Genitourinary: Normal external female genitalia.  SVE:   Dilation: 5 Effacement (%): 70 Station: -1 Presentation: Vertex Exam by:: Chryl Heck, CNM  NST Interpretation  Baseline: 145 bpm Variability: moderate Accelerations: present Decelerations: None Contractions: regular, every 6-8 minutes Impression: reactive   Glenetta Borg, CNM

## 2023-04-11 NOTE — Anesthesia Preprocedure Evaluation (Signed)
Anesthesia Evaluation  Patient identified by MRN, date of birth, ID band Patient awake    Reviewed: Allergy & Precautions, H&P , NPO status , Patient's Chart, lab work & pertinent test results  Airway Mallampati: II  TM Distance: >3 FB Neck ROM: full    Dental no notable dental hx.    Pulmonary asthma , Current Smoker   Pulmonary exam normal        Cardiovascular Exercise Tolerance: Good negative cardio ROS Normal cardiovascular exam     Neuro/Psych Seizures -, Well Controlled,     GI/Hepatic negative GI ROS,,,  Endo/Other    Renal/GU   negative genitourinary   Musculoskeletal   Abdominal   Peds  Hematology negative hematology ROS (+)   Anesthesia Other Findings Past Medical History: No date: Asthma No date: Seizures (HCC) No date: Tobacco use disorder  Past Surgical History: 07/13/2022: DIAGNOSTIC LAPAROSCOPY WITH REMOVAL OF ECTOPIC PREGNANCY;  N/A     Comment:  Procedure: DIAGNOSTIC LAPAROSCOPY WITH REMOVAL OF               ECTOPIC PREGNANCY;  Surgeon: Christeen Douglas, MD;                Location: [facility] ORS;  Service: Gynecology;  Laterality:               N/A; 10/03/2015: DILATION AND EVACUATION; N/A     Comment:  Procedure: DILATATION AND EVACUATION;  Surgeon: Nadara Mustard, MD;  Location: [facility] ORS;  Service: Gynecology;                Laterality: N/A; No date: VAGINAL DELIVERY  BMI    Body Mass Index: 29.05 kg/m      Reproductive/Obstetrics (+) Pregnancy                             Anesthesia Physical Anesthesia Plan  ASA: 2  Anesthesia Plan: Epidural   Post-op Pain Management:    Induction:   PONV Risk Score and Plan:   Airway Management Planned:   Additional Equipment:   Intra-op Plan:   Post-operative Plan:   Informed Consent: I have reviewed the patients History and Physical, chart, labs and discussed the procedure including the risks,  benefits and alternatives for the proposed anesthesia with the patient or authorized representative who has indicated his/her understanding and acceptance.       Plan Discussed with: Anesthesiologist and CRNA  Anesthesia Plan Comments:        Anesthesia Quick Evaluation

## 2023-04-11 NOTE — Progress Notes (Signed)
Labor Progress Note   ASSESSMENT/PLAN   Miranda King 27 y.o.   [id number]  at [redacted]w[redacted]d here for labor.  FWB:  - Fetal well being assessed: Category 1        GBS: - GBS negative  LABOR: -  Active labor, doing well. - Cervix swept - Intermittent monitoring - Pain Management: Plans epidural - Anticipate SVD   Labor Progress 1229: 5/70/-1 1432: 6/80/-1, BBOW Principal Problem:   Fetal tachycardia before the onset of labor   SUBJECTIVE/OBJECTIVE   SUBJECTIVE:  Miranda King is coping well. Contractions have spaced out a bit.   OBJECTIVE: Vital Signs: Patient Vitals for the past 12 hrs:  BP Temp Temp src Pulse Resp Height Weight  04/11/23 1258 109/69 98.3 F (36.8 C) Oral 89 16 5\' 6"  (1.676 m) 81.6 kg     FHR:   - Baseline: 140 - Variability: moderate - Accels: present - Decels: none Category 1  UTERINE ACTIVITY:  Contractions: q 6-10 minutes  Glenetta Borg, CNM

## 2023-04-12 LAB — RPR: RPR Ser Ql: NONREACTIVE

## 2023-04-12 MED ORDER — IBUPROFEN 600 MG PO TABS
600.0000 mg | ORAL_TABLET | Freq: Four times a day (QID) | ORAL | Status: DC
  Administered 2023-04-12: 600 mg via ORAL
  Filled 2023-04-12: qty 1

## 2023-04-12 MED ORDER — IBUPROFEN 600 MG PO TABS: 600.0000 mg | ORAL_TABLET | Freq: Four times a day (QID) | ORAL | 0 refills | Status: AC

## 2023-04-12 NOTE — Anesthesia Postprocedure Evaluation (Signed)
Anesthesia Post Note  Patient: Miranda King  Procedure(s) Performed: AN AD HOC LABOR EPIDURAL  Patient location during evaluation: Mother Baby Anesthesia Type: Epidural Level of consciousness: awake and alert Pain management: pain level controlled Vital Signs Assessment: post-procedure vital signs reviewed and stable Respiratory status: spontaneous breathing, nonlabored ventilation and respiratory function stable Cardiovascular status: blood pressure returned to baseline and stable Postop Assessment: no apparent nausea or vomiting, no headache, no backache and able to ambulate Anesthetic complications: no   No notable events documented.   Last Vitals:  Vitals:   04/12/23 0635 04/12/23 1139  BP: 111/67 115/67  Pulse: 74 75  Resp: 18 20  Temp: 36.6 C 36.7 C  SpO2: 99%     Last Pain:  Vitals:   04/12/23 1139  TempSrc: Oral  PainSc:                  Foye Deer

## 2023-04-12 NOTE — TOC Progression Note (Signed)
Transition of Care [facility]) - Progression Note    Patient Details  Name: FREDERICK MERSHON MRN: [id number] Date of Birth: [birth year]  [facility] CM/SW Contact  Susa Simmonds, [place] Phone Number: 04/12/2023, 3:11 PM  Clinical Narrative:   CSW spoke with patient at bedside. Patient states that her wish is to give her baby up for adoption. Patient states she is going through [employer]. Patient states she does not need anything from social work and she has no questions or concerns. Patient states she agrees to the adoption and this was her chose. Patient was able to sign ROI for herself and the baby. Patient would like for the adoptive parents to get their own room and have time with the baby.   CSW spoke with Delena Bali with [employer] who was present at the hospital with the adoptive parents. Madison provided the hospital with the relinquishment paperwork that was notarized. CSW placed copies of the paperwork in patient and babies file. Both files also have copies of adoptive parents and Madison's ID's. Madison states that the agency will have custody of the baby until the adoption is file. Madison will be present at discharge for the baby.          Expected Discharge Plan and Services         Expected Discharge Date: 04/12/23                                     Social Determinants of Health (SDOH) Interventions SDOH Screenings   Food Insecurity: No Food Insecurity (04/11/2023)  Housing: Low Risk  (04/11/2023)  Transportation Needs: No Transportation Needs (04/11/2023)  Utilities: At Risk (04/11/2023)  Financial Resource Strain: Low Risk  (04/17/2022)   Received from [facility]  Tobacco Use: High Risk (04/11/2023)    Readmission Risk Interventions     No data to display

## 2023-04-12 NOTE — Discharge Instructions (Signed)
Discharge Instructions:   Follow-up Appointment:  If there are any new medications, they have been ordered and will be available for pickup at the listed pharmacy on your way home from the hospital.   Call office if you have any of the following: headache, visual changes, fever >101.0 F, chills, shortness of breath, breast concerns, excessive vaginal bleeding, incision drainage or problems, leg pain or redness, depression or any other concerns. If you have vaginal discharge with an odor, let your doctor know.   It is normal to bleed for up to 6 weeks. You should not soak through more than 1 pad in 1 hour. If you have a blood clot larger than your fist with continued bleeding, call your doctor.   Activity: Do not lift > 10 lbs for 6 weeks (do not lift anything heavier than your baby). No intercourse, tampons, swimming pools, hot tubs, baths (only showers) for 6 weeks.  No driving for 1-2 weeks. Continue prenatal vitamin, especially if breastfeeding. Increase calories and fluids (water) while breastfeeding.   Your milk will come in, in the next couple of days (right now it is colostrum). You may have a slight fever when your milk comes in, but it should go away on its own.  If it does not, and rises above 101 F please call the doctor. You will also feel achy and your breasts will be firm. They will also start to leak. If you are breastfeeding, continue as you have been and you can pump/express milk for comfort.   If you have too much milk, your breasts can become engorged, which could lead to mastitis. This is an infection of the milk ducts. It can be very painful and you will need to notify your doctor to obtain a prescription for antibiotics. You can also treat it with a shower or hot/cold compress.   For concerns about your baby, please call your pediatrician.  For breastfeeding concerns, the lactation consultant can be reached at [phone].   Postpartum blues (feelings of happy one minute  and sad another minute) are normal for the first few weeks but if it gets worse let your doctor know.   Congratulations! We enjoyed caring for you and your new bundle of joy! Discharge Instructions:   Follow-up Appointment:  If there are any new medications, they have been ordered and will be available for pickup at the listed pharmacy on your way home from the hospital.   Call office if you have any of the following: headache, visual changes, fever >101.0 F, chills, shortness of breath, breast concerns, excessive vaginal bleeding, incision drainage or problems, leg pain or redness, depression or any other concerns. If you have vaginal discharge with an odor, let your doctor know.   It is normal to bleed for up to 6 weeks. You should not soak through more than 1 pad in 1 hour. If you have a blood clot larger than your fist with continued bleeding, call your doctor.   Activity: Do not lift > 10 lbs for 6 weeks (do not lift anything heavier than your baby). No intercourse, tampons, swimming pools, hot tubs, baths (only showers) for 6 weeks.  No driving for 1-2 weeks. Continue prenatal vitamin, especially if breastfeeding. Increase calories and fluids (water) while breastfeeding.   Your milk will come in, in the next couple of days (right now it is colostrum). You may have a slight fever when your milk comes in, but it should go away on its own.  If   it does not, and rises above 101 F please call the doctor. You will also feel achy and your breasts will be firm. They will also start to leak. If you are breastfeeding, continue as you have been and you can pump/express milk for comfort.   If you have too much milk, your breasts can become engorged, which could lead to mastitis. This is an infection of the milk ducts. It can be very painful and you will need to notify your doctor to obtain a prescription for antibiotics. You can also treat it with a shower or hot/cold compress.   For concerns about  your baby, please call your pediatrician.  For breastfeeding concerns, the lactation consultant can be reached at [phone].   Postpartum blues (feelings of happy one minute and sad another minute) are normal for the first few weeks but if it gets worse let your doctor know.   Congratulations! We enjoyed caring for you and your new bundle of joy!  

## 2023-04-12 NOTE — Plan of Care (Signed)
D/C order from MD.  Reviewed d/c instructions and prescriptions with patient and answered any questions.  Patient d/c home via wheelchair by nursing/auxillary. 

## 2023-04-15 ENCOUNTER — Ambulatory Visit (INDEPENDENT_AMBULATORY_CARE_PROVIDER_SITE_OTHER): Payer: Medicaid Other | Admitting: Obstetrics

## 2023-04-22 ENCOUNTER — Encounter: Payer: Medicaid Other | Admitting: Certified Nurse Midwife

## 2023-04-29 ENCOUNTER — Telehealth: Payer: Self-pay | Admitting: Obstetrics

## 2023-04-29 ENCOUNTER — Telehealth: Payer: Medicaid Other | Admitting: Obstetrics

## 2023-04-29 NOTE — Telephone Encounter (Signed)
 Reached out to pt to reschedule 2 week MyChart video visit that was scheduled on 114/025 at 3:35 with Quitman Livings.  Could not leave a message bc mailbox was full.

## 2023-04-30 ENCOUNTER — Encounter: Payer: Self-pay | Admitting: Obstetrics

## 2023-04-30 NOTE — Telephone Encounter (Signed)
 Reached out to pt (2x) to reschedule 2 week MyChart video visit that was scheduled on 04/29/2023 at 3:35 with Cinda Craze.  Could not leave a message bc mailbox was full.  Will send a MyChart letter.

## 2023-05-20 ENCOUNTER — Encounter: Payer: Self-pay | Admitting: Obstetrics

## 2023-05-20 ENCOUNTER — Telehealth: Payer: Self-pay

## 2023-05-20 ENCOUNTER — Ambulatory Visit (INDEPENDENT_AMBULATORY_CARE_PROVIDER_SITE_OTHER): Payer: Medicaid Other | Admitting: Obstetrics

## 2023-05-20 DIAGNOSIS — Z30017 Encounter for initial prescription of implantable subdermal contraceptive: Secondary | ICD-10-CM

## 2023-05-20 DIAGNOSIS — F418 Other specified anxiety disorders: Secondary | ICD-10-CM

## 2023-05-20 DIAGNOSIS — O99345 Other mental disorders complicating the puerperium: Secondary | ICD-10-CM

## 2023-05-20 MED ORDER — HYDROXYZINE HCL 25 MG PO TABS: 25.0000 mg | ORAL_TABLET | Freq: Four times a day (QID) | ORAL | 2 refills | Status: AC | PRN

## 2023-05-20 MED ORDER — ETONOGESTREL 68 MG ~~LOC~~ IMPL
68.0000 mg | DRUG_IMPLANT | Freq: Once | SUBCUTANEOUS | Status: AC
  Administered 2023-05-20: 68 mg via SUBCUTANEOUS

## 2023-05-20 NOTE — Telephone Encounter (Signed)
Patient states she has an appointment today for postpartum visit. She has a rx for prenatal vitamins that is costing her $5. She is inquiring if a new rx can be sent for generic that would cost less. Advised to discuss at visit today.

## 2023-05-20 NOTE — Progress Notes (Signed)
 Post Partum Visit Note  Miranda King is a 28 y.o. [phone] female who presents for a postpartum visit. She is 6 weeks postpartum following a normal spontaneous vaginal delivery.  I have fully reviewed the prenatal and intrapartum course. The delivery was at 39 gestational weeks.  Anesthesia: epidural. Postpartum course has been complicated by anxiety and insomnia. Baby was adopted and is doing well. Bleeding  has stopped . Bowel function is normal. Bladder function is normal. Patient is not sexually active. Contraception method: . Postpartum depression screening: negative. +for anxiety. [name] has not slept for more than an hour at a time since giving birth. She states when I close my eyes, I see the baby. She is secure in her decision about adoption but feels it is harder than she expected. Her support system has not been helpful to her. She feels that she is anxious all the time and not feeling stable. She denies SI. She feels she is managing her family life okay but feels concerned about her ability to cope. She would help with sleep and to talk to a counselor.  She desires a Nexplanon  today.   Health Maintenance Due  Topic Date Due   Pneumococcal Vaccine 58-79 Years old (1 of 2 - PCV) Never done   INFLUENZA VACCINE  11/14/2022   COVID-19 Vaccine (1 - 2024-25 season) Never done    The following portions of the patient's history were reviewed and updated as appropriate: allergies, current medications, past medical history, and problem list.  Review of Systems Pertinent items are noted in HPI.  Objective:  There were no vitals taken for this visit.   General:  alert, cooperative, and appears stated age   Breasts:  not indicated  Lungs: clear to auscultation bilaterally  Heart:  regular rate and rhythm, S1, S2 normal, no murmur, click, rub or gallop  Abdomen: soft, non-tender; bowel sounds normal; no masses,  no organomegaly   Wound: N/A  GU exam:  not indicated          Miranda King desires insertion of Nexplanon  contraceptive device. She desires reversible long-term contraception. We have thoroughly reviewed the risks, benefits, and alternatives, and she has elected to proceed with Nexplanon  insertion.     Procedure Note Left Arm Sterile Preparation:  Betadine  Insertion site was selected 10 cm from the medial epicondyle. The procedure area was prepped in sterile fashion. Adequate anesthesia was achieved with 2 mL of 1% lidocaine  injected subcutaneously. The Nexplanon  applicator was inserted subcutaneously and the Nexplanon  device was delivered. The applicator was removed from the insertion site. The capsule was palpated by myself and the patient to confirm satisfactory placement. Blood loss was minimal. A pressure dressing was applied.  The patient tolerated the procedure well with no complications. Standard post-procedure care and return precautions were explained.    Assessment:   6-week postpartum exam.  Postpartum Anxiety Nexplanon  placement  Plan:   Essential components of care per ACOG recommendations:  1.  Anxiety: referral to [facility] walk-in clinic. Spoke to [place] from [place] who will send in recommendations for providers who accept [facility].   2. Sexuality, contraception and birth spacing - Patient does not want a pregnancy in the next year.  Desired family size is 2 children.  - Reviewed reproductive life planning. Reviewed contraceptive methods based on pt preferences and effectiveness.  Patient desired Hormonal Implant   4. Sleep and fatigue -Encouraged family/partner/community support of 4 hrs of uninterrupted sleep to help with mood and  fatigue. Rx sent for hydroxyzine .  5. Physical Recovery   - Patient had a  NSVD . Patient had an intact perineum. Perineal healing reviewed. Patient expressed understanding - Patient has urinary incontinence? No. - Patient is safe to resume physical and sexual activity  6.  Health  Maintenance - HM due items addressed Yes - Last pap smear  Diagnosis  Date Value Ref Range Status  01/02/2023 (A)  Final   - Atypical squamous cells of undetermined significance (ASC-US )   Pap smear not done at today's visit.  -Breast Cancer screening indicated? No.   7. Chronic Disease/Pregnancy Condition follow up:  ASCUS with HPV - needs colposcopy  - PCP follow up  -Mood check  Eleanor CHRISTELLA Canny, CNM [facility] [place]

## 2023-06-02 NOTE — Patient Instructions (Incomplete)
 Colposcopy, Care After  The following information offers guidance on how to care for yourself after your procedure. Your doctor may also give you more specific instructions. If you have problems or questions, contact your doctor. What can I expect after the procedure? If you did not have a sample of your tissue taken out (did not have a biopsy), you may only have some spotting of blood for a few days. You can go back to your normal activities. If you had a sample of your tissue taken out, it is common to have: Soreness and mild pain. These may last for a few days. Mild bleeding or fluid (discharge) coming from your vagina. The fluid will look dark and grainy. You may have this for a few days. The fluid may be caused by a liquid that was used during your procedure. You may need to wear a sanitary pad. Spotting of blood for at least 48 hours after the procedure. Follow these instructions at home: Medicines Take over-the-counter and prescription medicines only as told by your doctor. Ask your doctor what over-the-counter pain medicines and prescription medicines you can start taking again. This is very important if you take blood thinners. Activity For at least 3 days, or for as long as told by your doctor, avoid: Douching. Using tampons. Having sex. Return to your normal activities as told by your doctor. Ask your doctor what activities are safe for you. General instructions Ask your doctor if you may take baths, swim, or use a hot tub. You may take showers. If you use birth control (contraception), keep using it. Keep all follow-up visits. Contact a doctor if: You have a fever or chills. You faint or feel light-headed. Get help right away if: You bleed a lot from your vagina. A lot of bleeding means that the bleeding soaks through a pad in less than 1 hour. You have clumps of blood (blood clots) coming from your vagina. You have signs that could mean you have an infection. This may be  fluid coming from your vagina that is: Different than normal. Yellow. Bad-smelling. You have very bad pain or cramps in your lower belly that do not get better with medicine. Summary If you did not have a sample of your tissue taken out, you may only have some spotting of blood for a few days. You can go back to your normal activities. If you had a sample of your tissue taken out, it is common to have mild pain for a few days and spotting for 48 hours. Avoid douching, using tampons, and having sex for at least 3 days after the procedure or for as long as told. Get help right away if you have a lot of bleeding, very bad pain, or signs of infection. This information is not intended to replace advice given to you by your health care provider. Make sure you discuss any questions you have with your health care provider. Document Revised: 08/27/2020 Document Reviewed: 08/27/2020 Elsevier Patient Education  2024 [employer].

## 2023-06-02 NOTE — Progress Notes (Deleted)
 Referring Provider:  Doreene Burke  HPI:  Miranda King is a 28 y.o.  [phone]  who presents today for evaluation and management of abnormal cervical cytology.    Prior pap smears:  Date:01/02/23 AOZ:HYQMV HPV: Positive Date:06/17/18 HQI:ONGEX HPV : not resulted  Date:  Prior cervical / vaginal findings: *** Date:*** Results: ***  Prior cervical treatment(s): *** Date:*** Results: ***  Symptoms/History:  -Abnormal vaginal discharge: *** -Postmenopausal: *** -Intermenstrual bleeding: *** -Postcoital bleeding: *** -Bleeding problems (non-gyn): *** -Contraception: *** -Number of current sexual partners: *** -Number of partners in lifetime: *** -History of a high risk partner: *** -History of STDs: *** -Smoking: ***      ROS:  {Ros - complete:30496}  OB History  Gravida Para Term Preterm AB Living  4 3 3  1 3   SAB IAB Ectopic Multiple Live Births   0 1 0 3    # Outcome Date GA Lbr Len/2nd Weight Sex Type Anes PTL Lv  4 Term 04/11/23 [redacted]w[redacted]d 11:20 / 00:23 6 lb 14.4 oz (3.13 kg) M Vag-Spont EPI  LIV  3 Ectopic 07/14/22     ECTOPIC     2 Term 08/23/17 [redacted]w[redacted]d / 00:23 7 lb 0.5 oz (3.19 kg) M Vag-Spont EPI  LIV     Birth Comments: No anomalies noted at delivery  1 Term 07/05/12 [redacted]w[redacted]d  6 lb 9 oz (2.977 kg) F Vag-Spont   LIV    Past Medical History:  Diagnosis Date   Asthma    Seizures (HCC)    Tobacco use disorder     Past Surgical History:  Procedure Laterality Date   DIAGNOSTIC LAPAROSCOPY WITH REMOVAL OF ECTOPIC PREGNANCY N/A 07/13/2022   Procedure: DIAGNOSTIC LAPAROSCOPY WITH REMOVAL OF ECTOPIC PREGNANCY;  Surgeon: Christeen Douglas, MD;  Location: [facility] ORS;  Service: Gynecology;  Laterality: N/A;   DILATION AND EVACUATION N/A 10/03/2015   Procedure: DILATATION AND EVACUATION;  Surgeon: Nadara Mustard, MD;  Location: [facility] ORS;  Service: Gynecology;  Laterality: N/A;   VAGINAL DELIVERY      SOCIAL HISTORY:  Social History   Substance and Sexual Activity   Alcohol Use No   Alcohol/week: 0.0 standard drinks of alcohol    Social History   Substance and Sexual Activity  Drug Use Yes   Types: Marijuana     Family History  Problem Relation Age of Onset   Migraines Mother    Asthma Sister    Hypertension Maternal Grandmother    Diabetes Maternal Grandmother     ALLERGIES:  Coconut flavoring agent (non-screening) and Tramadol hcl  She has a current medication list which includes the following prescription(s): albuterol, hydroxyzine, ibuprofen, and preplus.  Physical Exam: -Vitals:  LMP  (LMP Unknown)   PROCEDURE: Colposcopy performed with 4% acetic acid and Lugol's after informed consent obtained.  Physical Exam                            -Aceto-white Lesions Location(s): See above              -Biopsy performed at *** o'clock               -ECC indicated and performed: {yes no:314532}     -Biopsy sites made hemostatic with pressure and Monsel's solution   -Satisfactory colposcopy: {yes no:314532}    -Evidence of Invasive cervical CA :  NO  ASSESSMENT:  Miranda King is a 28 y.o. [id number] with LSIL***ASCUS and  HPV-HR positive*** [birth year] POS***NEG on recent pap (DATE), here for colposcopy today, performed as above without complications.  -ECC and 3 cervical bx sent to pathology -Aftercare instructions for home reviewed, si/sx of when to call/return discussed. -RTC 2 weeks for results, sooner prn  No orders of the defined types were placed in this encounter.          F/U  No follow-ups on file.   Julieanne Manson, DO [facility]

## 2023-06-03 ENCOUNTER — Ambulatory Visit: Payer: Medicaid Other | Admitting: Obstetrics

## 2023-06-03 ENCOUNTER — Telehealth: Payer: Self-pay | Admitting: Obstetrics

## 2023-06-03 DIAGNOSIS — R87610 Atypical squamous cells of undetermined significance on cytologic smear of cervix (ASC-US): Secondary | ICD-10-CM

## 2023-06-03 NOTE — Telephone Encounter (Signed)
 Reached out to pt to reschedule colpo that was scheduled on 218/2025 at 9:15 with Dr. Lonny Prude.  Could not leave a message bc mailbox was full.

## 2023-06-04 NOTE — Telephone Encounter (Signed)
 Pt has been rescheduled to 06/10/2023 at 9:15 with Dr. Lonny Prude.

## 2023-06-05 ENCOUNTER — Encounter: Payer: Self-pay | Admitting: Obstetrics

## 2023-06-09 ENCOUNTER — Emergency Department: Payer: Medicaid Other

## 2023-06-09 ENCOUNTER — Encounter: Payer: Self-pay | Admitting: Emergency Medicine

## 2023-06-09 ENCOUNTER — Emergency Department
Admission: EM | Admit: 2023-06-09 | Discharge: 2023-06-10 | Disposition: A | Discharge status: Home / Self Care | Payer: Medicaid Other | Attending: Emergency Medicine | Admitting: Emergency Medicine

## 2023-06-09 DIAGNOSIS — R1030 Lower abdominal pain, unspecified: Secondary | ICD-10-CM | POA: Diagnosis present

## 2023-06-09 LAB — CBC WITH DIFFERENTIAL/PLATELET
Abs Immature Granulocytes: 0.01 10*3/uL (ref 0.00–0.07)
Basophils Absolute: 0 10*3/uL (ref 0.0–0.1)
Basophils Relative: 0 %
Eosinophils Absolute: 0.1 10*3/uL (ref 0.0–0.5)
Eosinophils Relative: 1 %
HCT: 40.8 % (ref 36.0–46.0)
Hemoglobin: 14 g/dL (ref 12.0–15.0)
Immature Granulocytes: 0 %
Lymphocytes Relative: 51 %
Lymphs Abs: 2.7 10*3/uL (ref 0.7–4.0)
MCH: 29.3 pg (ref 26.0–34.0)
MCHC: 34.3 g/dL (ref 30.0–36.0)
MCV: 85.4 fL (ref 80.0–100.0)
Monocytes Absolute: 0.3 10*3/uL (ref 0.1–1.0)
Monocytes Relative: 6 %
Neutro Abs: 2.2 10*3/uL (ref 1.7–7.7)
Neutrophils Relative %: 42 %
Platelets: 334 10*3/uL (ref 150–400)
RBC: 4.78 MIL/uL (ref 3.87–5.11)
RDW: 13.6 % (ref 11.5–15.5)
WBC: 5.3 10*3/uL (ref 4.0–10.5)
nRBC: 0 % (ref 0.0–0.2)

## 2023-06-09 LAB — COMPREHENSIVE METABOLIC PANEL
ALT: 28 U/L (ref 0–44)
AST: 34 U/L (ref 15–41)
Albumin: 3.7 g/dL (ref 3.5–5.0)
Alkaline Phosphatase: 56 U/L (ref 38–126)
Anion gap: 9 (ref 5–15)
BUN: 9 mg/dL (ref 6–20)
CO2: 22 mmol/L (ref 22–32)
Calcium: 9 mg/dL (ref 8.9–10.3)
Chloride: 106 mmol/L (ref 98–111)
Creatinine, Ser: 0.81 mg/dL (ref 0.44–1.00)
GFR, Estimated: >60 mL/min (ref >60)
Glucose, Bld: 105 mg/dL — ABNORMAL HIGH (ref 70–99)
Potassium: 3.5 mmol/L (ref 3.5–5.1)
Sodium: 137 mmol/L (ref 135–145)
Total Bilirubin: 0.4 mg/dL (ref 0.0–1.2)
Total Protein: 6.9 g/dL (ref 6.5–8.1)

## 2023-06-09 LAB — URINALYSIS, ROUTINE W REFLEX MICROSCOPIC
Bilirubin Urine: NEGATIVE
Glucose, UA: NEGATIVE mg/dL
Hgb urine dipstick: NEGATIVE
Ketones, ur: NEGATIVE mg/dL
Leukocytes,Ua: NEGATIVE
Nitrite: NEGATIVE
Protein, ur: NEGATIVE mg/dL
Specific Gravity, Urine: 1.004 — ABNORMAL LOW (ref 1.005–1.030)
pH: 7 (ref 5.0–8.0)

## 2023-06-09 LAB — TYPE AND SCREEN
ABO/RH(D): O POS
Antibody Screen: NEGATIVE

## 2023-06-09 LAB — LACTIC ACID, PLASMA: Lactic Acid, Venous: 1.8 mmol/L (ref 0.5–1.9)

## 2023-06-09 LAB — HCG, QUANTITATIVE, PREGNANCY: hCG, Beta Chain, Quant, S: <1 m[IU]/mL (ref <5)

## 2023-06-09 LAB — POC URINE PREG, ED: Preg Test, Ur: NEGATIVE

## 2023-06-09 MED ORDER — HYDROMORPHONE HCL 1 MG/ML IJ SOLN
1.0000 mg | Freq: Once | INTRAMUSCULAR | Status: AC
  Administered 2023-06-09: 1 mg via INTRAVENOUS
  Filled 2023-06-09: qty 1

## 2023-06-09 MED ORDER — ONDANSETRON HCL 4 MG/2ML IJ SOLN
4.0000 mg | Freq: Once | INTRAMUSCULAR | Status: AC
  Administered 2023-06-09: 4 mg via INTRAVENOUS
  Filled 2023-06-09: qty 2

## 2023-06-09 MED ORDER — IOHEXOL 350 MG/ML SOLN
80.0000 mL | Freq: Once | INTRAVENOUS | Status: AC | PRN
  Administered 2023-06-09: 75 mL via INTRAVENOUS

## 2023-06-09 MED ORDER — SODIUM CHLORIDE 0.9 % IV BOLUS
1000.0000 mL | Freq: Once | INTRAVENOUS | Status: AC
  Administered 2023-06-09: 1000 mL via INTRAVENOUS

## 2023-06-09 MED ORDER — KETOROLAC TROMETHAMINE 15 MG/ML IJ SOLN
15.0000 mg | Freq: Once | INTRAMUSCULAR | Status: AC
  Administered 2023-06-09: 15 mg via INTRAVENOUS
  Filled 2023-06-09: qty 1

## 2023-06-09 NOTE — ED Triage Notes (Signed)
 Pt arrived via [facility] from home with sudden onset of upper epigastric pain that radiates around bilateral flank. Per pt, pain started right after consuming spaghetti for dinner. Pt post-partum vaginal deliver, full term on 04/11/23. Hx/o ectopic pregnancies in past.   ** Pt notes she is not breast-feeding due to child adoption and pt denies vaginal intercourse since post-partum.   ** 1st post-partum menstrual cycle began 06/06/23.

## 2023-06-09 NOTE — ED Notes (Signed)
 Patient up to toilet in room without incident and back to bed on monitor. No distress noted. Pts mother now at the bedside.

## 2023-06-09 NOTE — ED Notes (Signed)
 On arrival patient in severe pain and unable to remain calm/still. MD Bradler performed bedside US of abdomen. IV placed and meds given. Patient is now resting on her L side in no s/s of distress. Awaiting further orders. Will continue to monitor.

## 2023-06-09 NOTE — Discharge Instructions (Addendum)
 You can take 600 mg of ibuprofen or 650 mg of Tylenol every 6 hours as needed for pain.

## 2023-06-09 NOTE — ED Notes (Signed)
 Pts mother at the bedside questioning this nurse on [place] results and after permission granted to speak to this person from patient she was informed that a bedside US was performed on pts abdomen by MD Bradler and no findings. She was then informed that a CT scan is ordered and will be completed after negative pregnancy is verified. Also that labs so far are reassuring. Mother was informed of pain control and the patient was resting quietly prior to her arrival.

## 2023-06-09 NOTE — ED Notes (Signed)
 Pts mother observed in the room opening supply cabinets. This nurse asked if there is something she needed and tissues were requested. Same were provided. Monitor in place. No distress. Pt tearful and reassured that findings so far are reassuring.

## 2023-06-09 NOTE — ED Notes (Signed)
 Patient to CT. Pts mother insisted on going with her despite explaining she could not be in the room and would be in the hall.

## 2023-06-09 NOTE — ED Provider Notes (Signed)
 [facility] Provider Note   Event Date/Time   First MD Initiated Contact with Patient 06/09/23 2101     (approximate) History  Abdominal Pain  HPI Miranda King is a 28 y.o. female with stated past medical history of recent childbirth on 04/11/2023 who presents via EMS complaining of 10/10 generalized abdominal pain that feels similar to previous fallopian tube rupture.  Patient also states that she has had an ectopic pregnancy that feels similar.  Patient states that she is currently on her period.  Further history and review of systems are unable to be obtained secondary to patient acuity.  Of note patient does have history of ectopic pregnancy with fallopian tube rupture. ROS: Patient currently denies any vision changes, tinnitus, difficulty speaking, facial droop, sore throat, chest pain, shortness of breath, nausea/vomiting/diarrhea, dysuria, or weakness/numbness/paresthesias in any extremity   Physical Exam  Triage Vital Signs: ED Triage Vitals  Encounter Vitals Group     BP      Systolic BP Percentile      Diastolic BP Percentile      Pulse      Resp      Temp      Temp src      SpO2      Weight      Height      Head Circumference      Peak Flow      Pain Score      Pain Loc      Pain Education      Exclude from Growth Chart    Most recent vital signs: Vitals:   06/09/23 2200 06/09/23 2242  BP: (!) 106/58 134/85  Pulse: 83 75  Resp: (!) 26 15  Temp:    SpO2: 100% 100%   General: Awake, oriented x4. CV:  Good peripheral perfusion.  Resp:  Normal effort.  Abd:  No distention.  Generalized tenderness to palpation.  Negative FAST exam Other:  Middle-aged overweight African-American female writhing in stretcher in distress secondary to pain ED Results / Procedures / Treatments  Labs (all labs ordered are listed, but only abnormal results are displayed) Labs Reviewed  COMPREHENSIVE METABOLIC PANEL - Abnormal; Notable for the  following components:      Result Value   Glucose, Bld 105 (*)    All other components within normal limits  URINALYSIS, ROUTINE W REFLEX MICROSCOPIC - Abnormal; Notable for the following components:   Color, Urine STRAW (*)    APPearance CLEAR (*)    Specific Gravity, Urine 1.004 (*)    All other components within normal limits  CBC WITH DIFFERENTIAL/PLATELET  LACTIC ACID, PLASMA  HCG, QUANTITATIVE, PREGNANCY  POC URINE PREG, ED  TYPE AND SCREEN   RADIOLOGY ED MD interpretation: Pending -Agree with radiology assessment Official radiology report(s): No results found. PROCEDURES: Critical Care performed: No Procedures MEDICATIONS ORDERED IN ED: Medications  HYDROmorphone (DILAUDID) injection 1 mg (1 mg Intravenous Given 06/09/23 2122)  ondansetron (ZOFRAN) injection 4 mg (4 mg Intravenous Given 06/09/23 2121)  sodium chloride 0.9 % bolus 1,000 mL (0 mLs Intravenous Stopped 06/09/23 2214)  iohexol (OMNIPAQUE) 350 MG/ML injection 80 mL (75 mLs Intravenous Contrast Given 06/09/23 2228)   IMPRESSION / MDM / ASSESSMENT AND PLAN / ED COURSE  I reviewed the triage vital signs and the nursing notes.  The patient is on the cardiac monitor to evaluate for evidence of arrhythmia and/or significant heart rate changes. Patient's presentation is most consistent with acute presentation with potential threat to life or bodily function. Patient's symptoms not typical for emergent causes of abdominal pain such as, but not limited to, appendicitis, abdominal aortic aneurysm, surgical biliary disease, pancreatitis, SBO, mesenteric ischemia, serious intra-abdominal bacterial illness. Presentation also not typical of gynecologic emergencies such as TOA, Ovarian Torsion, PID. Not Ectopic. Doubt atypical ACS.  Pt tolerating PO. Disposition: Care of this patient will be signed out to the oncoming physician at the end of my shift.  All pertinent patient information conveyed and  all questions answered.  All further care and disposition decisions will be made by the oncoming physician. Clinical Course as of 06/09/23 2309  Mon Jun 09, 2023  2304 Signed out pending CT a/p, likely dc after. Here for abd pain. [TT]    Clinical Course User Index [TT] Jodie Echevaria, Franchot Erichsen, MD   FINAL CLINICAL IMPRESSION(S) / ED DIAGNOSES   Final diagnoses:  Lower abdominal pain   Rx / DC Orders   ED Discharge Orders     None      Note:  This document was prepared using Dragon voice recognition software and may include unintentional dictation errors.   Merwyn Katos, MD 06/09/23 [phone]

## 2023-06-09 NOTE — Progress Notes (Deleted)
 Referring Provider:  Doreene Burke  HPI:  Miranda King is a 28 y.o.  [phone]  who presents today for evaluation and management of abnormal cervical cytology.    Prior pap smears:  Date:01/02/23 LKG:MWNUU HPV: Positive Date:06/17/18 VOZ:DGUYQ HPV : not resulted     Prior cervical / vaginal findings: n/a  HPV Vaccine:03/14/10 & 01/22/08   Symptoms/History:  -Abnormal vaginal discharge: *** -Postmenopausal: *** -Intermenstrual bleeding: *** -Postcoital bleeding: *** -Bleeding problems (non-gyn): *** -Contraception: *** -Number of current sexual partners: *** -Number of partners in lifetime: *** -History of a high risk partner: *** -History of STDs: *** -Smoking: ***      ROS:  {Ros - complete:30496}  OB History  Gravida Para Term Preterm AB Living  4 3 3  1 3   SAB IAB Ectopic Multiple Live Births   0 1 0 3    # Outcome Date GA Lbr Len/2nd Weight Sex Type Anes PTL Lv  4 Term 04/11/23 [redacted]w[redacted]d 11:20 / 00:23 6 lb 14.4 oz (3.13 kg) M Vag-Spont EPI  LIV  3 Ectopic 07/14/22     ECTOPIC     2 Term 08/23/17 [redacted]w[redacted]d / 00:23 7 lb 0.5 oz (3.19 kg) M Vag-Spont EPI  LIV     Birth Comments: No anomalies noted at delivery  1 Term 07/05/12 [redacted]w[redacted]d  6 lb 9 oz (2.977 kg) F Vag-Spont   LIV    Past Medical History:  Diagnosis Date   Asthma    Seizures (HCC)    Tobacco use disorder     Past Surgical History:  Procedure Laterality Date   DIAGNOSTIC LAPAROSCOPY WITH REMOVAL OF ECTOPIC PREGNANCY N/A 07/13/2022   Procedure: DIAGNOSTIC LAPAROSCOPY WITH REMOVAL OF ECTOPIC PREGNANCY;  Surgeon: Christeen Douglas, MD;  Location: [facility];  Service: Gynecology;  Laterality: N/A;   DILATION AND EVACUATION N/A 10/03/2015   Procedure: DILATATION AND EVACUATION;  Surgeon: Nadara Mustard, MD;  Location: [facility];  Service: Gynecology;  Laterality: N/A;   VAGINAL DELIVERY      SOCIAL HISTORY:  Social History   Substance and Sexual Activity  Alcohol Use No   Alcohol/week: 0.0 standard  drinks of alcohol    Social History   Substance and Sexual Activity  Drug Use Yes   Types: Marijuana     Family History  Problem Relation Age of Onset   Migraines Mother    Asthma Sister    Hypertension Maternal Grandmother    Diabetes Maternal Grandmother     ALLERGIES:  Coconut flavoring agent (non-screening) and Tramadol hcl  She has a current medication list which includes the following prescription(s): albuterol, hydroxyzine, ibuprofen, and preplus.  Physical Exam: -Vitals:  LMP  (LMP Unknown)   PROCEDURE: Colposcopy performed with 4% acetic acid and Lugol's after informed consent obtained.  Physical Exam                            -Aceto-white Lesions Location(s): See above              -Biopsy performed at *** o'clock               -ECC indicated and performed: {yes no:314532}     -Biopsy sites made hemostatic with pressure and Monsel's solution   -Satisfactory colposcopy: {yes no:314532}    -Evidence of Invasive cervical CA :  NO  ASSESSMENT:  Miranda King is a 28 y.o. [id number] with LSIL***ASCUS and HPV-HR positive*** [birth year] POS***NEG  on recent pap (DATE), here for colposcopy today, performed as above without complications.  -ECC and 3 cervical bx sent to pathology -Aftercare instructions for home reviewed, si/sx of when to call/return discussed. -RTC 2 weeks for results, sooner prn  No orders of the defined types were placed in this encounter.          F/U  No follow-ups on file.   Julieanne Manson, DO [facility]

## 2023-06-10 ENCOUNTER — Telehealth: Payer: Self-pay | Admitting: Obstetrics

## 2023-06-10 ENCOUNTER — Ambulatory Visit: Payer: Medicaid Other | Admitting: Obstetrics

## 2023-06-10 DIAGNOSIS — Z3200 Encounter for pregnancy test, result unknown: Secondary | ICD-10-CM

## 2023-06-10 DIAGNOSIS — R87610 Atypical squamous cells of undetermined significance on cytologic smear of cervix (ASC-US): Secondary | ICD-10-CM

## 2023-06-10 NOTE — ED Provider Notes (Signed)
.-----------------------------------------   12:39 AM on 06/10/2023 -----------------------------------------  Blood pressure (!) 109/59, pulse 62, temperature 98.4 F (36.9 C), temperature source Oral, resp. rate 16, height 5\' 6"  (1.676 m), weight 68 kg, last menstrual period 06/06/2023, SpO2 99%, not currently breastfeeding.  Assuming care from Dr. Vicente Males.  In short, Miranda King is a 28 y.o. female with a chief complaint of Abdominal Pain .  Refer to the original H&P for additional details.  The current plan of care is to follow-up CT abdomen pelvis, if negative, able to be discharged home.  Independent review of labs and imaging, UA is negative, pregnancy test is negative, electrolytes not severely deranged, creatinine is normal, LFTs are normal, no leukocytosis, lactate normal.  CT imaging showed no acute findings.  On reassessment patient's abdomen is soft, nontender while in talking to her.  Shared decision making done with patient and mom, recommended outpatient GI follow-up.  Patient has an appointment with her OB/GYN tomorrow.  Will give the number to call for our GI doctors to set up follow-up but mom states that she can also go through [facility].  Considered but no indication for inpatient admission at this time, she is safe for outpatient management.  Will discharge with strict precautions.  Clinical Course as of 06/10/23 0047  Mon Jun 09, 2023  2304 Signed out pending CT a/p, likely dc after. Here for abd pain. [TT]  2346 CT ABDOMEN PELVIS W CONTRAST IMPRESSION: No acute findings in the abdomen or pelvis.   [TT]    Clinical Course User Index [TT] Claybon Jabs, MD      Claybon Jabs, MD 06/10/23 [phone]

## 2023-06-10 NOTE — ED Notes (Signed)
 Per mothers request and against patients wishes to walk out the patient was taken to lobby in wheel chair.

## 2023-06-10 NOTE — Telephone Encounter (Signed)
 Reached out to pt to reschedule colpo appt that was scheduled on 06/10/2023 at 9:15 with Dr. Lonny Prude.  Could not leave a message bc mailbox was full.

## 2023-06-11 NOTE — Telephone Encounter (Signed)
 Contacted the patient via phone. She has confirmed scheduling for 3/20 with Dr. Lonny Prude.

## 2023-07-03 ENCOUNTER — Ambulatory Visit: Payer: Medicaid Other | Admitting: Obstetrics

## 2023-07-03 ENCOUNTER — Telehealth: Payer: Self-pay | Admitting: Obstetrics

## 2023-07-03 NOTE — Telephone Encounter (Signed)
 Reached out to pt to reschedule procedure appt that was scheduled on 07/03/2023 at 9:15 with Dr. Lonny Prude.  Left message for pt to call back to reschedule.

## 2023-07-03 NOTE — Progress Notes (Deleted)
 Referring Provider:  Doreene Burke  HPI:  Miranda King is a 28 y.o.  [phone]  who presents today for evaluation and management of abnormal cervical cytology.    Prior pap smears:  Date:01/02/23 JYN:WGNFA HPV: Positive Date:06/17/18 OZH:YQMVH HPV : not resulted     Prior cervical / vaginal findings: n/a  HPV Vaccine:03/14/10 & 01/22/08   Symptoms/History:  -Abnormal vaginal discharge: *** -Postmenopausal: *** -Intermenstrual bleeding: *** -Postcoital bleeding: *** -Bleeding problems (non-gyn): *** -Contraception: *** -Number of current sexual partners: *** -Number of partners in lifetime: *** -History of a high risk partner: *** -History of STDs: *** -Smoking: ***      ROS:  {Ros - complete:30496}  OB History  Gravida Para Term Preterm AB Living  4 3 3  1 3   SAB IAB Ectopic Multiple Live Births   0 1 0 3    # Outcome Date GA Lbr Len/2nd Weight Sex Type Anes PTL Lv  4 Term 04/11/23 [redacted]w[redacted]d 11:20 / 00:23 6 lb 14.4 oz (3.13 kg) M Vag-Spont EPI  LIV  3 Ectopic 07/14/22     ECTOPIC     2 Term 08/23/17 [redacted]w[redacted]d / 00:23 7 lb 0.5 oz (3.19 kg) M Vag-Spont EPI  LIV     Birth Comments: No anomalies noted at delivery  1 Term 07/05/12 [redacted]w[redacted]d  6 lb 9 oz (2.977 kg) F Vag-Spont   LIV    Past Medical History:  Diagnosis Date   Asthma    Seizures (HCC)    Tobacco use disorder     Past Surgical History:  Procedure Laterality Date   DIAGNOSTIC LAPAROSCOPY WITH REMOVAL OF ECTOPIC PREGNANCY N/A 07/13/2022   Procedure: DIAGNOSTIC LAPAROSCOPY WITH REMOVAL OF ECTOPIC PREGNANCY;  Surgeon: Christeen Douglas, MD;  Location: [facility];  Service: Gynecology;  Laterality: N/A;   DILATION AND EVACUATION N/A 10/03/2015   Procedure: DILATATION AND EVACUATION;  Surgeon: Nadara Mustard, MD;  Location: [facility];  Service: Gynecology;  Laterality: N/A;   VAGINAL DELIVERY      SOCIAL HISTORY:  Social History   Substance and Sexual Activity  Alcohol Use No   Alcohol/week: 0.0 standard  drinks of alcohol    Social History   Substance and Sexual Activity  Drug Use Yes   Types: Marijuana     Family History  Problem Relation Age of Onset   Migraines Mother    Asthma Sister    Hypertension Maternal Grandmother    Diabetes Maternal Grandmother     ALLERGIES:  Coconut flavoring agent (non-screening) and Tramadol hcl  She has a current medication list which includes the following prescription(s): albuterol, hydroxyzine, ibuprofen, and preplus.  Physical Exam: -Vitals:  LMP 06/06/2023   PROCEDURE: Colposcopy performed with 4% acetic acid and Lugol's after informed consent obtained.  Physical Exam                            -Aceto-white Lesions Location(s): See above              -Biopsy performed at *** o'clock               -ECC indicated and performed: {yes no:314532}     -Biopsy sites made hemostatic with pressure and Monsel's solution   -Satisfactory colposcopy: {yes no:314532}    -Evidence of Invasive cervical CA :  NO  ASSESSMENT:  Miranda King is a 28 y.o. [id number] with LSIL***ASCUS and HPV-HR positive*** [birth year] POS***NEG on recent  pap (DATE), here for colposcopy today, performed as above without complications.  -ECC and 3 cervical bx sent to pathology -Aftercare instructions for home reviewed, si/sx of when to call/return discussed. -RTC 2 weeks for results, sooner prn  No orders of the defined types were placed in this encounter.          F/U  No follow-ups on file.   Julieanne Manson, DO [facility]

## 2023-07-04 NOTE — Telephone Encounter (Signed)
 Reached out to pt (2x) to reschedule procedure appt that was scheduled on 07/03/2023 at 9:15 with Dr. Lonny Prude.  Was able to reschedule the appt to 07/31/2023 with Dr. Lonny Prude at 9:15.

## 2023-07-31 ENCOUNTER — Other Ambulatory Visit (HOSPITAL_COMMUNITY)
Admission: RE | Admit: 2023-07-31 | Discharge: 2023-07-31 | Disposition: A | Discharge status: Home / Self Care | Source: Ambulatory Visit | Attending: Obstetrics | Admitting: Obstetrics

## 2023-07-31 ENCOUNTER — Encounter: Payer: Self-pay | Admitting: Obstetrics

## 2023-07-31 ENCOUNTER — Ambulatory Visit: Admitting: Obstetrics

## 2023-07-31 VITALS — BP 109/58 | HR 82 | Ht 66.0 in | Wt 160.0 lb

## 2023-07-31 DIAGNOSIS — R87610 Atypical squamous cells of undetermined significance on cytologic smear of cervix (ASC-US): Secondary | ICD-10-CM

## 2023-07-31 DIAGNOSIS — N858 Other specified noninflammatory disorders of uterus: Secondary | ICD-10-CM | POA: Diagnosis not present

## 2023-07-31 DIAGNOSIS — R87810 Cervical high risk human papillomavirus (HPV) DNA test positive: Secondary | ICD-10-CM

## 2023-07-31 DIAGNOSIS — B977 Papillomavirus as the cause of diseases classified elsewhere: Secondary | ICD-10-CM

## 2023-07-31 DIAGNOSIS — Z3202 Encounter for pregnancy test, result negative: Secondary | ICD-10-CM | POA: Diagnosis not present

## 2023-07-31 LAB — POCT URINE PREGNANCY: Preg Test, Ur: NEGATIVE

## 2023-07-31 NOTE — Patient Instructions (Signed)
 Colposcopy, Care After  The following information offers guidance on how to care for yourself after your procedure. Your doctor may also give you more specific instructions. If you have problems or questions, contact your doctor. What can I expect after the procedure? If you did not have a sample of your tissue taken out (did not have a biopsy), you may only have some spotting of blood for a few days. You can go back to your normal activities. If you had a sample of your tissue taken out, it is common to have: Soreness and mild pain. These may last for a few days. Mild bleeding or fluid (discharge) coming from your vagina. The fluid will look dark and grainy. You may have this for a few days. The fluid may be caused by a liquid that was used during your procedure. You may need to wear a sanitary pad. Spotting of blood for at least 48 hours after the procedure. Follow these instructions at home: Medicines Take over-the-counter and prescription medicines only as told by your doctor. Ask your doctor what over-the-counter pain medicines and prescription medicines you can start taking again. This is very important if you take blood thinners. Activity For at least 3 days, or for as long as told by your doctor, avoid: Douching. Using tampons. Having sex. Return to your normal activities as told by your doctor. Ask your doctor what activities are safe for you. General instructions Ask your doctor if you may take baths, swim, or use a hot tub. You may take showers. If you use birth control (contraception), keep using it. Keep all follow-up visits. Contact a doctor if: You have a fever or chills. You faint or feel light-headed. Get help right away if: You bleed a lot from your vagina. A lot of bleeding means that the bleeding soaks through a pad in less than 1 hour. You have clumps of blood (blood clots) coming from your vagina. You have signs that could mean you have an infection. This may be  fluid coming from your vagina that is: Different than normal. Yellow. Bad-smelling. You have very bad pain or cramps in your lower belly that do not get better with medicine. Summary If you did not have a sample of your tissue taken out, you may only have some spotting of blood for a few days. You can go back to your normal activities. If you had a sample of your tissue taken out, it is common to have mild pain for a few days and spotting for 48 hours. Avoid douching, using tampons, and having sex for at least 3 days after the procedure or for as long as told. Get help right away if you have a lot of bleeding, very bad pain, or signs of infection. This information is not intended to replace advice given to you by your health care provider. Make sure you discuss any questions you have with your health care provider. Document Revised: 08/27/2020 Document Reviewed: 08/27/2020 Elsevier Patient Education  2024 [employer].

## 2023-07-31 NOTE — Progress Notes (Signed)
 Referring Provider:  Internal  HPI:  Sharonlee L Burdell is a 28 y.o.  [id number]  who presents today for evaluation and management of abnormal cervical cytology.    Prior pap smears:  Date:01/02/23  XBJ:YNWGN  HPV: Positive   Prior cervical / vaginal findings: n/a Date:n/a Results: n/a  Prior cervical treatment(s): n/a Date:n/a Results: n/a  Symptoms/History:  -Abnormal vaginal discharge: no -Postmenopausal: no -Intermenstrual bleeding: no -Postcoital bleeding: no -Bleeding problems (non-gyn): no -Contraception: nexplanon -Number of current sexual partners: 0 -Number of partners in lifetime: 4 -History of a high risk partner: no -History of STDs: no -Smoking: yes  -Gardasil Vaccine: utd      ROS:  Pertinent items are noted in HPI.  OB History  Gravida Para Term Preterm AB Living  4 3 3  1 3   SAB IAB Ectopic Multiple Live Births   0 1 0 3    # Outcome Date GA Lbr Len/2nd Weight Sex Type Anes PTL Lv  4 Term 04/11/23 [redacted]w[redacted]d 11:20 / 00:23 6 lb 14.4 oz (3.13 kg) M Vag-Spont EPI  LIV  3 Ectopic 07/14/22     ECTOPIC     2 Term 08/23/17 [redacted]w[redacted]d / 00:23 7 lb 0.5 oz (3.19 kg) M Vag-Spont EPI  LIV     Birth Comments: No anomalies noted at delivery  1 Term 07/05/12 [redacted]w[redacted]d  6 lb 9 oz (2.977 kg) F Vag-Spont   LIV    Past Medical History:  Diagnosis Date   Asthma    Seizures (HCC)    Tobacco use disorder     Past Surgical History:  Procedure Laterality Date   DIAGNOSTIC LAPAROSCOPY WITH REMOVAL OF ECTOPIC PREGNANCY N/A 07/13/2022   Procedure: DIAGNOSTIC LAPAROSCOPY WITH REMOVAL OF ECTOPIC PREGNANCY;  Surgeon: Christeen Douglas, MD;  Location: [facility];  Service: Gynecology;  Laterality: N/A;   DILATION AND EVACUATION N/A 10/03/2015   Procedure: DILATATION AND EVACUATION;  Surgeon: Nadara Mustard, MD;  Location: [facility];  Service: Gynecology;  Laterality: N/A;   VAGINAL DELIVERY      SOCIAL HISTORY:  Social History   Substance and Sexual Activity  Alcohol Use No    Alcohol/week: 0.0 standard drinks of alcohol    Social History   Substance and Sexual Activity  Drug Use Yes   Types: Marijuana     Family History  Problem Relation Age of Onset   Migraines Mother    Asthma Sister    Hypertension Maternal Grandmother    Diabetes Maternal Grandmother     ALLERGIES:  Coconut flavoring agent (non-screening) and Tramadol hcl  She has a current medication list which includes the following prescription(s): preplus, albuterol, hydroxyzine, and ibuprofen.  Physical Exam: -Vitals:  BP (!) 109/58   Pulse 82   Ht 5\' 6"  (1.676 m)   Wt 160 lb (72.6 kg)   LMP 07/16/2023   BMI 25.82 kg/m   PROCEDURE: Colposcopy performed with 4% acetic acid and Lugol's after informed consent obtained.  Physical Exam Vitals and nursing note reviewed. Exam conducted with a chaperone present.  Constitutional:      Appearance: Normal appearance.  HENT:     Head: Normocephalic and atraumatic.  Eyes:     Extraocular Movements: Extraocular movements intact.  Pulmonary:     Effort: Pulmonary effort is normal.  Abdominal:     General: Abdomen is flat.     Palpations: Abdomen is soft.  Genitourinary:    General: Normal vulva.     Vagina: Normal.  Cervix: Normal. No discharge, friability, lesion or erythema.  Neurological:     Mental Status: She is alert.                -Aceto-white Lesions Location(s): None              -Biopsy performed: N/A              -ECC indicated and performed: Yes.     -Satisfactory colposcopy: Yes.      -Evidence of Invasive cervical CA :  NO  ASSESSMENT:  Amyiah L Lear is a 28 y.o. [id number] with ASCUS and HPV-HR positive, 16/18/[birth year] NEG on recent pap (01/02/23 during pregnancy), here for colposcopy today, performed as above without complications.  -ECC sent to pathology -Aftercare instructions for home reviewed, si/sx of when to call/return discussed. -Smoking cessation encouraged; is one of the biggest, modifiable risk factors  for HPV and cervical cancer -Will call with results   Sofia Dunn, DO [facility]

## 2023-08-01 ENCOUNTER — Encounter: Payer: Self-pay | Admitting: Obstetrics

## 2023-08-01 LAB — SURGICAL PATHOLOGY

## 2023-08-12 ENCOUNTER — Ambulatory Visit: Payer: Medicaid Other | Admitting: Obstetrics

## 2023-08-19 ENCOUNTER — Ambulatory Visit
